# Patient Record
Sex: Female | Born: 1985 | Race: Black or African American | Hispanic: No | Marital: Single | Smoking: Former smoker
Health system: Southern US, Community
[De-identification: ages and names within clinical notes are randomized; demographics above are authoritative.]

## PROBLEM LIST (undated history)

## (undated) DIAGNOSIS — O139 Gestational [pregnancy-induced] hypertension without significant proteinuria, unspecified trimester: Secondary | ICD-10-CM

## (undated) DIAGNOSIS — F329 Major depressive disorder, single episode, unspecified: Secondary | ICD-10-CM

## (undated) DIAGNOSIS — IMO0002 Reserved for concepts with insufficient information to code with codable children: Secondary | ICD-10-CM

## (undated) DIAGNOSIS — R87619 Unspecified abnormal cytological findings in specimens from cervix uteri: Secondary | ICD-10-CM

## (undated) DIAGNOSIS — F32A Depression, unspecified: Secondary | ICD-10-CM

## (undated) DIAGNOSIS — R011 Cardiac murmur, unspecified: Secondary | ICD-10-CM

---
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
## 2005-07-09 ENCOUNTER — Inpatient Hospital Stay (HOSPITAL_COMMUNITY): Admission: RE | Admit: 2005-07-09 | Discharge: 2005-07-11 | Payer: Self-pay | Admitting: Psychiatry

## 2005-07-10 ENCOUNTER — Ambulatory Visit: Payer: Self-pay | Admitting: Psychiatry

## 2005-11-22 ENCOUNTER — Emergency Department (HOSPITAL_COMMUNITY): Admission: EM | Admit: 2005-11-22 | Discharge: 2005-11-22 | Payer: Self-pay | Admitting: Family Medicine

## 2006-01-16 ENCOUNTER — Emergency Department (HOSPITAL_COMMUNITY): Admission: EM | Admit: 2006-01-16 | Discharge: 2006-01-16 | Payer: Self-pay | Admitting: Emergency Medicine

## 2006-01-17 ENCOUNTER — Emergency Department (HOSPITAL_COMMUNITY): Admission: EM | Admit: 2006-01-17 | Discharge: 2006-01-17 | Payer: Self-pay | Admitting: Emergency Medicine

## 2006-02-26 ENCOUNTER — Emergency Department (HOSPITAL_COMMUNITY): Admission: EM | Admit: 2006-02-26 | Discharge: 2006-02-26 | Payer: Self-pay | Admitting: Family Medicine

## 2006-02-28 ENCOUNTER — Emergency Department (HOSPITAL_COMMUNITY): Admission: EM | Admit: 2006-02-28 | Discharge: 2006-02-28 | Payer: Self-pay | Admitting: Family Medicine

## 2006-11-18 ENCOUNTER — Emergency Department (HOSPITAL_COMMUNITY): Admission: EM | Admit: 2006-11-18 | Discharge: 2006-11-18 | Payer: Self-pay | Admitting: Emergency Medicine

## 2007-03-03 ENCOUNTER — Emergency Department (HOSPITAL_COMMUNITY): Admission: EM | Admit: 2007-03-03 | Discharge: 2007-03-03 | Payer: Self-pay | Admitting: Emergency Medicine

## 2007-07-26 ENCOUNTER — Emergency Department (HOSPITAL_COMMUNITY): Admission: EM | Admit: 2007-07-26 | Discharge: 2007-07-26 | Payer: Self-pay | Admitting: Family Medicine

## 2007-09-25 ENCOUNTER — Emergency Department (HOSPITAL_COMMUNITY): Admission: EM | Admit: 2007-09-25 | Discharge: 2007-09-25 | Payer: Self-pay | Admitting: Emergency Medicine

## 2007-11-26 ENCOUNTER — Emergency Department (HOSPITAL_COMMUNITY): Admission: EM | Admit: 2007-11-26 | Discharge: 2007-11-26 | Payer: Self-pay | Admitting: Emergency Medicine

## 2008-04-21 ENCOUNTER — Emergency Department (HOSPITAL_COMMUNITY): Admission: EM | Admit: 2008-04-21 | Discharge: 2008-04-21 | Payer: Self-pay | Admitting: Emergency Medicine

## 2009-02-17 ENCOUNTER — Emergency Department (HOSPITAL_COMMUNITY): Admission: EM | Admit: 2009-02-17 | Discharge: 2009-02-18 | Payer: Self-pay | Admitting: Emergency Medicine

## 2010-01-13 NOTE — L&D Delivery Note (Signed)
Delivery Note At 4:15 PM a viable female was delivered via Vaginal, Spontaneous Delivery (Presentation: ;  ).  APGAR: , ; weight .   Placenta status: Intact, Spontaneous Pathology.  Cord:  with the following complications: None.  Cord pH: not done  Anesthesia: Epidural  Episiotomy: None Lacerations: Sulcus Suture Repair: 2.0 Est. Blood Loss (mL): 250  Mom to postpartum.  Baby to nursery-stable.  MARSHALL,BERNARD A 11/22/2010, 4:24 PM

## 2010-02-12 ENCOUNTER — Emergency Department (HOSPITAL_COMMUNITY)
Admission: EM | Admit: 2010-02-12 | Discharge: 2010-02-12 | Payer: Self-pay | Source: Home / Self Care | Admitting: Family Medicine

## 2010-04-22 ENCOUNTER — Inpatient Hospital Stay (HOSPITAL_COMMUNITY): Payer: Medicaid Other

## 2010-04-22 ENCOUNTER — Encounter (HOSPITAL_COMMUNITY): Payer: Self-pay | Admitting: Radiology

## 2010-04-22 ENCOUNTER — Inpatient Hospital Stay (HOSPITAL_COMMUNITY)
Admission: AD | Admit: 2010-04-22 | Discharge: 2010-04-22 | Disposition: A | Discharge status: Home / Self Care | Payer: Medicaid Other | Source: Ambulatory Visit | Attending: Family Medicine | Admitting: Family Medicine

## 2010-04-22 DIAGNOSIS — N760 Acute vaginitis: Secondary | ICD-10-CM | POA: Insufficient documentation

## 2010-04-22 DIAGNOSIS — B9689 Other specified bacterial agents as the cause of diseases classified elsewhere: Secondary | ICD-10-CM

## 2010-04-22 DIAGNOSIS — O2390 Unspecified genitourinary tract infection in pregnancy, unspecified trimester: Secondary | ICD-10-CM

## 2010-04-22 DIAGNOSIS — R109 Unspecified abdominal pain: Secondary | ICD-10-CM | POA: Insufficient documentation

## 2010-04-22 DIAGNOSIS — A499 Bacterial infection, unspecified: Secondary | ICD-10-CM

## 2010-04-22 LAB — URINALYSIS, ROUTINE W REFLEX MICROSCOPIC
Bilirubin Urine: NEGATIVE
Glucose, UA: NEGATIVE mg/dL
Hgb urine dipstick: NEGATIVE
Ketones, ur: NEGATIVE mg/dL
Nitrite: NEGATIVE
Protein, ur: NEGATIVE mg/dL
Specific Gravity, Urine: >1.03 — ABNORMAL HIGH (ref 1.005–1.030)
Urobilinogen, UA: 0.2 mg/dL (ref 0.0–1.0)
pH: 6 (ref 5.0–8.0)

## 2010-04-22 LAB — HCG, QUANTITATIVE, PREGNANCY: hCG, Beta Chain, Quant, S: 5085 m[IU]/mL — ABNORMAL HIGH (ref <5)

## 2010-04-22 LAB — WET PREP, GENITAL

## 2010-04-22 LAB — POCT PREGNANCY, URINE: Preg Test, Ur: POSITIVE

## 2010-04-23 LAB — GC/CHLAMYDIA PROBE AMP, GENITAL: Chlamydia, DNA Probe: NEGATIVE

## 2010-04-24 LAB — POCT PREGNANCY, URINE: Preg Test, Ur: NEGATIVE

## 2010-04-24 LAB — WET PREP, GENITAL: WBC, Wet Prep HPF POC: NONE SEEN
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
## 2010-05-31 NOTE — Discharge Summary (Signed)
[facility]  Patient:    Sandra Lee, Sandra Lee                       MRN: [id number] Adm. Date:  [id number] Disc. Date: 02/11/00 Attending:  Veneta Penton                           Discharge Summary  REASON FOR ADMISSION:  This 25 year old black female was admitted involuntarily complaining of depression with suicidal ideation and plan to kill herself via drug overdose.  Her physical examination at the time of admission was significant only for allergic rhinitis and a heart murmur.  She also complained of occasional chest pain.  LABORATORY AND X-RAY DATA:  An EKG showed right atrial and ventricular enlargement.  An echocardiogram and pediatric cardiology consult have been scheduled for outpatient follow-up.  The patients other laboratory was significant for urine probe for gonorrhea and Chlamydia was negative.  A CBC showed an MCV of 73.4, MCHC of 35.2, and was otherwise unremarkable.  A routine chemistry panel was within normal limits.  Hepatic panel was within normal limits.  Thyroid function tests were within normal limits.  A urine pregnancy test was negative.  A urine drug screen was negative.  RPR was nonreactive.  A UA was unremarkable.  The patient received no x-rays, no special procedures, and no additional consultations.  She sustained no complications during the course of this hospitalization.  HOSPITAL COURSE:  The patient was initially begun on a trial of Remeron 30 mg p.o. q.h.s.  She reported that this helped her with sleep, but on the day of discharge has been complaining of increasing sedation from the Remeron as well as daily headaches from the medication.  We have discussed the risks, benefits, and side effects with the patient and her mother including alternatives to this as well as the alternative of using no medication, and we have agreed for a trial of Celexa 20 mg p.o. q.d.  The patient will start this on an outpatient basis.   She is presently attending all therapeutic treatment programs on the unit.  She has actively been engaged in psychotherapy, discussing her problems with her family, predominantly her mother and grandmother.  She at the time of discharge denies any homicidal or suicidal ideation.  Her affect and mood have improved as have her activities of daily living.  She is no longer displaying any psychomotor agitation, and consequently she is felt to have reached her maximum benefits of hospitalization as she no longer appears to be a danger to herself.  CONDITION ON DISCHARGE:  Improved.  CURRENT DIAGNOSES ACCORDING TO DSM4: Axis I:    1. Major depression, recurrent type, severe without psychosis.            2. Oppositional defiant disorder.            3. Rule out attention deficit hyperactivity disorder. Axis II:   Rule out learning disorder, not otherwise specified. Axis III:  Allergic rhinitis. Axis IV:   Current psychosocial stressors are severe. Axis V:    Code 20 on admission; code 30 on discharge.  FURTHER EVALUATION AND TREATMENT RECOMMENDATIONS:  DISCHARGE FOLLOWUP:  The patient is scheduled for follow-up with pediatric cardiology at [facility].  She is also scheduled for follow-up echocardiogram at [facility] on Friday, February 15.  She will follow up with [facility] and at [facility] for  all further aspects of her mental health care, and consequently, I will sign off on the case at this time.  DISCHARGE INSTRUCTIONS:  She is discharged on an unrestricted level of activity and a regular diet.  DISCHARGE MEDICATIONS:  She is discharged on Celexa 20 mg p.o. q.d. DD:  02/11/00 TD:  02/11/00 Job: [id number]

## 2010-07-16 LAB — RPR: RPR: NONREACTIVE

## 2010-07-16 LAB — HEPATITIS B SURFACE ANTIGEN: Hepatitis B Surface Ag: NEGATIVE

## 2010-09-27 ENCOUNTER — Inpatient Hospital Stay (HOSPITAL_COMMUNITY)
Admission: AD | Admit: 2010-09-27 | Discharge: 2010-09-27 | Disposition: A | Discharge status: Home / Self Care | Payer: Medicaid Other | Source: Ambulatory Visit | Attending: Obstetrics & Gynecology | Admitting: Obstetrics & Gynecology

## 2010-09-27 DIAGNOSIS — Z3480 Encounter for supervision of other normal pregnancy, unspecified trimester: Secondary | ICD-10-CM | POA: Insufficient documentation

## 2010-09-27 DIAGNOSIS — Z2989 Encounter for other specified prophylactic measures: Secondary | ICD-10-CM | POA: Insufficient documentation

## 2010-09-27 DIAGNOSIS — Z298 Encounter for other specified prophylactic measures: Secondary | ICD-10-CM | POA: Insufficient documentation

## 2010-09-27 MED ORDER — RHO D IMMUNE GLOBULIN 1500 UNIT/2ML IJ SOLN
300.0000 ug | Freq: Once | INTRAMUSCULAR | Status: AC
  Administered 2010-09-27: 300 ug via INTRAMUSCULAR
  Filled 2010-09-27: qty 2

## 2010-09-27 NOTE — ED Notes (Signed)
Patient given handout on Rhophylac explained wait time of 1 to 1/2 hours, pt verbalized an understanding.

## 2010-09-30 LAB — RH IG WORKUP (INCLUDES ABO/RH)
ABO/RH(D): O NEG
Gestational Age(Wks): 28
Unit division: 0

## 2010-10-04 LAB — POCT PREGNANCY, URINE: Operator id: 28833

## 2010-10-04 LAB — I-STAT 8, (EC8 V) (CONVERTED LAB)
Acid-base deficit: 3 — ABNORMAL HIGH
Bicarbonate: 21.6
Chloride: 109
HCT: 47 — ABNORMAL HIGH
Operator id: 288331
TCO2: 23
pCO2, Ven: 37 — ABNORMAL LOW
pH, Ven: 7.375 — ABNORMAL HIGH

## 2010-10-04 LAB — CBC
Hemoglobin: 13.9
MCHC: 33.7
RDW: 12.9

## 2010-10-04 LAB — DIFFERENTIAL
Basophils Absolute: 0
Basophils Relative: 0
Lymphocytes Relative: 14
Monocytes Absolute: 0.1
Neutro Abs: 9.8 — ABNORMAL HIGH
Neutrophils Relative %: 85 — ABNORMAL HIGH

## 2010-10-04 LAB — ETHANOL: Alcohol, Ethyl (B): 6

## 2010-10-04 LAB — POCT I-STAT CREATININE
Creatinine, Ser: 0.7
Operator id: 288331

## 2010-10-04 LAB — RAPID URINE DRUG SCREEN, HOSP PERFORMED
Amphetamines: NOT DETECTED
Barbiturates: NOT DETECTED

## 2010-10-04 LAB — PREGNANCY, URINE: Preg Test, Ur: NEGATIVE

## 2010-10-04 LAB — ACETAMINOPHEN LEVEL: Acetaminophen (Tylenol), Serum: <10 — ABNORMAL LOW

## 2010-10-04 LAB — SALICYLATE LEVEL: Salicylate Lvl: <4

## 2010-10-15 LAB — WET PREP, GENITAL
Trich, Wet Prep: NONE SEEN
Yeast Wet Prep HPF POC: NONE SEEN

## 2010-10-15 LAB — POCT URINALYSIS DIP (DEVICE)
Ketones, ur: NEGATIVE mg/dL
Protein, ur: >=300 mg/dL — AB
Specific Gravity, Urine: 1.02 (ref 1.005–1.030)
Urobilinogen, UA: 1 mg/dL (ref 0.0–1.0)
pH: 6.5 (ref 5.0–8.0)

## 2010-10-15 LAB — POCT PREGNANCY, URINE: Preg Test, Ur: NEGATIVE

## 2010-11-18 ENCOUNTER — Encounter (HOSPITAL_COMMUNITY): Payer: Self-pay | Admitting: *Deleted

## 2010-11-18 ENCOUNTER — Inpatient Hospital Stay (HOSPITAL_COMMUNITY)
Admission: AD | Admit: 2010-11-18 | Discharge: 2010-11-25 | DRG: 774 | Disposition: A | Discharge status: Home / Self Care | Payer: Medicaid Other | Source: Ambulatory Visit | Attending: Obstetrics & Gynecology | Admitting: Obstetrics & Gynecology

## 2010-11-18 DIAGNOSIS — IMO0002 Reserved for concepts with insufficient information to code with codable children: Principal | ICD-10-CM | POA: Diagnosis present

## 2010-11-18 DIAGNOSIS — O139 Gestational [pregnancy-induced] hypertension without significant proteinuria, unspecified trimester: Secondary | ICD-10-CM | POA: Diagnosis present

## 2010-11-18 HISTORY — DX: Depression, unspecified: F32.A

## 2010-11-18 HISTORY — DX: Unspecified abnormal cytological findings in specimens from cervix uteri: R87.619

## 2010-11-18 HISTORY — DX: Cardiac murmur, unspecified: R01.1

## 2010-11-18 HISTORY — DX: Major depressive disorder, single episode, unspecified: F32.9

## 2010-11-18 HISTORY — DX: Gestational (pregnancy-induced) hypertension without significant proteinuria, unspecified trimester: O13.9

## 2010-11-18 HISTORY — DX: Reserved for concepts with insufficient information to code with codable children: IMO0002

## 2010-11-18 MED ORDER — ZOLPIDEM TARTRATE 5 MG PO TABS: 5.0000 mg | ORAL_TABLET | Freq: Every evening | ORAL | Status: DC | PRN

## 2010-11-19 ENCOUNTER — Inpatient Hospital Stay (HOSPITAL_COMMUNITY): Payer: Medicaid Other

## 2010-11-19 ENCOUNTER — Encounter (HOSPITAL_COMMUNITY): Payer: Self-pay | Admitting: Obstetrics & Gynecology

## 2010-11-19 DIAGNOSIS — O139 Gestational [pregnancy-induced] hypertension without significant proteinuria, unspecified trimester: Secondary | ICD-10-CM | POA: Diagnosis present

## 2010-11-19 HISTORY — DX: Gestational (pregnancy-induced) hypertension without significant proteinuria, unspecified trimester: O13.9

## 2010-11-19 MED ORDER — PRENATAL PLUS 27-1 MG PO TABS
1.0000 | ORAL_TABLET | Freq: Every day | ORAL | Status: DC
  Administered 2010-11-19 – 2010-11-21 (×3): 1 via ORAL
  Filled 2010-11-19 (×3): qty 1

## 2010-11-19 MED ORDER — SODIUM CHLORIDE 0.9 % IJ SOLN
3.0000 mL | Freq: Two times a day (BID) | INTRAMUSCULAR | Status: DC
  Administered 2010-11-19 – 2010-11-21 (×5): 3 mL via INTRAVENOUS

## 2010-11-19 MED ORDER — FERROUS SULFATE 325 (65 FE) MG PO TABS: 325.0000 mg | ORAL_TABLET | Freq: Two times a day (BID) | ORAL | Status: DC

## 2010-11-19 NOTE — Consult Note (Signed)
MATERNAL FETAL MEDICINE CONSULT  Patient Name: Sandra Lee Medical Record Number:  [id number] Date of Birth: [birth year] Requesting Physician Name:  Roseanna Rainbow, MD Date of Service: 11/19/2010  Chief Complaint  Third trimester  pregnancy Hypertension  History of Present Illness [name] was seen today for prenatal diagnosis secondary to development of increased blood pressure in the office at the request of Dr. Tamela Oddi.  The patient is a 25 y.o. G1P0000, with an EDD of 12/19/2010, by Last Menstrual Period dating method.  She has no apparent pre-existing history of hypertension, autoimmune, or renal disease.  She does have a family history of hypertension, stroke and diabetes.  Lab values were obtained in office either yesterday or today, but are not available on the record. She denies headache, visual signs or abdominal pain.  The fetus is active. Since hospitalization, Brennyn has continued to have all but one blood pressure readings 144-157 SBP/91-96 DBP  Review of Systems Pertinent items are noted in HPI.  Patient History OB History    Grav Para Term Preterm Abortions TAB SAB Ect Mult Living   1 0 0 0 0 0 0 0 0 0      # Outc Date GA Lbr Len/2nd Wgt Sex Del Anes PTL Lv   1 CUR               Past Medical History  Diagnosis Date  . Heart murmur 25 yrs old    not sure if it is still present.  . Abnormal Pap smear   . Depression     hx of depression - not taking medication  . PIH (pregnancy induced hypertension), antepartum 11/19/2010    History reviewed. No pertinent past surgical history.  History   Social History  . Marital Status: Single    Spouse Name: N/A    Number of Children: N/A  . Years of Education: N/A   Social History Main Topics  . Smoking status: Former Smoker -- 0.5 packs/day    Types: Cigarettes    Quit date: 09/18/2010  . Smokeless tobacco: None  . Alcohol Use: No  . Drug Use: No  . Sexually Active: Yes    Birth Control/  Protection: Injection   Other Topics Concern  . None   Social History Narrative  . None    Family History  Problem Relation Age of Onset  . Depression Mother   . Early death Father   . Arthritis Sister   . Diabetes Maternal Aunt   . Early death Maternal Aunt   . Alcohol abuse Maternal Uncle   . Diabetes Maternal Uncle   . Diabetes Paternal Aunt   . Diabetes Paternal Uncle   . Arthritis Maternal Grandmother   . Diabetes Maternal Grandmother   . Heart disease Maternal Grandmother   . Stroke Maternal Grandmother   . Diabetes Maternal Grandfather    In addition, the patient has no family history of mental retardation, birth defects, or genetic diseases.  Physical Examination Filed Vitals:   11/19/10 1614  BP: 157/94  Pulse: 81  Temp: 98.3 F (36.8 C)  Resp: 20   General exam: Normal HEENT No neck masses.  Normal pulmonary exam. Normal cardiac exam.  Slight RUQ tenderness to deep palpation. No fundal tenderness.  Reflexes 2/4. 2+ edema.   Ultrasound of fetus; Normal amniotic fluid, cephalic presentation.  Estimated fetal weight is 2.3 kg, with abdominal circumference <3%le.   Assessment and Recommendations: Findings consistent with either gestational hypertension or preeclampsia.  With fetal size suggesting SGA/IUGR, this may be representative of severe preeclampsia.  Awaiting initial lab assessments.  1. Continue hospital monitoring of maternal and fetal condition; 2. Repeat renal, hepatic and platelet functions 1-2 times/week; deliver if evidence of HELLP 3. Daily NST's.  4. Weekly U/S reassessment of AFI. Obtain fetal umbilical artery Doppler studies tomorrow.  6. Deliver if evidence of worsening headaches, BP's persistently >160 mmHg systolic, >100 mmHg diastolic; persistent headaches or visual signs;  Evidence of HELLP; bleeding; abnormal fetal testing, or lack of response of BP to rest with underlying SGA/IUGR fetus.  7. Deliver at 37 weeks if stable before  then.   I spent 20 minutes in face to face consultation with the patient. Thank you for the opportunity to work with [facility]

## 2010-11-19 NOTE — H&P (Signed)
Sandra Lee is Lee 25 y.o. female presenting for further evaluation of elevated blood pressures. Maternal Medical History:  Reason for admission: Elevated BPs in the office.  No neurological symptoms.  Fetal activity: Perceived fetal activity is normal.    Prenatal complications: no prenatal complications   OB History    Grav Para Term Preterm Abortions TAB SAB Ect Mult Living   1 0 0 0 0 0 0 0 0 0      Past Medical History  Diagnosis Date  . Heart murmur 25 yrs old    not sure if it is still present.  . Abnormal Pap smear   . Depression     hx of depression - not taking medication  . PIH (pregnancy induced hypertension), antepartum 11/19/2010   History reviewed. No pertinent past surgical history. Family History: family history includes Alcohol abuse in her maternal uncle; Arthritis in her maternal grandmother and sister; Depression in her mother; Diabetes in her maternal aunt, maternal grandfather, maternal grandmother, maternal uncle, paternal aunt, and paternal uncle; Early death in her father and maternal aunt; Heart disease in her maternal grandmother; and Stroke in her maternal grandmother. Social History:  reports that she quit smoking about 2 months ago. Her smoking use included Cigarettes. She smoked .5 packs per day. She does not have any smokeless tobacco history on file. She reports that she does not drink alcohol or use illicit drugs.  Review of Systems  Constitutional: Negative for fever.  Eyes: Negative for blurred vision.  Respiratory: Negative for shortness of breath.   Gastrointestinal: Negative for vomiting.  Skin: Negative for rash.  Neurological: Negative.  Negative for headaches.    Dilation: 1 Effacement (%): Thick Exam by:: Sandra Lee Blood pressure 156/96, pulse 78, temperature 97.7 F (36.5 C), temperature source Oral, resp. rate 20, height 5\' 5"  (1.651 m), weight 73.936 kg (163 lb), last menstrual period 03/14/2010, SpO2 100.00%. Maternal  Exam:  Abdomen: Patient reports no abdominal tenderness. Introitus: not evaluated.     Fetal Exam Fetal Monitor Review: Variability: moderate (6-25 bpm).   Pattern: accelerations present and no decelerations.    Fetal State Assessment: Category I - tracings are normal.     Physical Exam  Constitutional: She appears well-developed.  HENT:  Head: Normocephalic.  Neck: Neck supple. No thyromegaly present.  Cardiovascular: Normal rate and regular rhythm.   Respiratory: Breath sounds normal.  GI: Soft. Bowel sounds are normal.  Musculoskeletal: She exhibits no edema.  Skin: No rash noted.    Prenatal labs: ABO, Rh: --/--/O NEG (09/14 1335) Antibody: NEG (09/14 1335) Rubella:   RPR:    HBsAg:    HIV:    GBS:     Assessment/Plan: 25 y.o. with an IUP at [redacted]w[redacted]d.  PIH.  Labile BPs.  No neurological symptoms. FHT reassuring  MFM consult with U/S for growth Check PIH labs, 24 hr urine Bed rest   Sandra Lee 11/19/2010, 10:04 AM

## 2010-11-20 ENCOUNTER — Inpatient Hospital Stay (HOSPITAL_COMMUNITY): Payer: Medicaid Other

## 2010-11-20 LAB — COMPREHENSIVE METABOLIC PANEL
BUN: 5 mg/dL — ABNORMAL LOW (ref 6–23)
Calcium: 9.7 mg/dL (ref 8.4–10.5)
Creatinine, Ser: 0.52 mg/dL (ref 0.50–1.10)
GFR calc Af Amer: >90 mL/min (ref >90)
Glucose, Bld: 130 mg/dL — ABNORMAL HIGH (ref 70–99)
Total Protein: 6.3 g/dL (ref 6.0–8.3)

## 2010-11-20 LAB — PROTEIN, URINE, 24 HOUR: Urine Total Volume-UPROT: 1100 mL

## 2010-11-20 LAB — CBC
HCT: 33.5 % — ABNORMAL LOW (ref 36.0–46.0)
Hemoglobin: 11.6 g/dL — ABNORMAL LOW (ref 12.0–15.0)
MCH: 27.7 pg (ref 26.0–34.0)
MCHC: 34.6 g/dL (ref 30.0–36.0)
RDW: 14 % (ref 11.5–15.5)

## 2010-11-20 LAB — CREATININE, URINE, 24 HOUR
Collection Interval-UCRE24: 24 hours
Urine Total Volume-UCRE24: 1100 mL

## 2010-11-20 MED ORDER — INFLUENZA VIRUS VACC SPLIT PF IM SUSP
0.5000 mL | Freq: Once | INTRAMUSCULAR | Status: AC
  Administered 2010-11-20: 0.5 mL via INTRAMUSCULAR
  Filled 2010-11-20: qty 0.5

## 2010-11-20 MED ORDER — TETANUS-DIPHTH-ACELL PERTUSSIS 5-2.5-18.5 LF-MCG/0.5 IM SUSP
0.5000 mL | Freq: Once | INTRAMUSCULAR | Status: AC
  Administered 2010-11-20: 0.5 mL via INTRAMUSCULAR
  Filled 2010-11-20: qty 0.5

## 2010-11-20 NOTE — Progress Notes (Signed)
UR chart review completed.  

## 2010-11-20 NOTE — Progress Notes (Signed)
Admission Health Hx assessment trigger for unintentional weight loss of > 10 lbs. Pt had a 12 Lbs weight gain in the 3 weeks PTA. Pt not at nutritional risk.

## 2010-11-20 NOTE — Progress Notes (Signed)
S: No complaints  O: Blood pressure 166/90, pulse 80, temperature 97.6 F (36.4 C), temperature source Oral, resp. rate 20, height 5\' 5"  (1.651 m), weight 72.576 kg (160 lb), last menstrual period 03/14/2010, SpO2 100.00%.   ZOX:WRUEAVWU: 140 bpm, Variability: Good {> 6 bpm), Accelerations: Reactive and Decelerations: Absent Toco: None [id number]:XBJYNWGN: 1 Effacement (%): Thick Exam by:: Dr. Tamela Oddi  A/P- 25 y.o. admitted with pre-eclampsia  Dating:  [redacted]w[redacted]d Continue close maternal/fetal surveillance Serial labs U/A doppler today per MFM TDAP/flu vaccine

## 2010-11-21 LAB — CBC
HCT: 33 % — ABNORMAL LOW (ref 36.0–46.0)
Hemoglobin: 11.4 g/dL — ABNORMAL LOW (ref 12.0–15.0)
MCHC: 34.5 g/dL (ref 30.0–36.0)
MCV: 79.9 fL (ref 78.0–100.0)
RDW: 14 % (ref 11.5–15.5)

## 2010-11-21 MED ORDER — LACTATED RINGERS IV SOLN
INTRAVENOUS | Status: DC
  Administered 2010-11-22: 20:00:00 via INTRAVENOUS

## 2010-11-21 MED ORDER — ONDANSETRON HCL 4 MG/2ML IJ SOLN
4.0000 mg | Freq: Four times a day (QID) | INTRAMUSCULAR | Status: DC | PRN
  Administered 2010-11-22: 4 mg via INTRAVENOUS
  Filled 2010-11-21: qty 2

## 2010-11-21 MED ORDER — HYDROXYZINE HCL 50 MG PO TABS
50.0000 mg | ORAL_TABLET | Freq: Four times a day (QID) | ORAL | Status: DC | PRN
  Filled 2010-11-21: qty 1

## 2010-11-21 MED ORDER — HYDROXYZINE HCL 50 MG/ML IM SOLN
50.0000 mg | Freq: Four times a day (QID) | INTRAMUSCULAR | Status: DC | PRN
  Filled 2010-11-21: qty 1

## 2010-11-21 MED ORDER — TERBUTALINE SULFATE 1 MG/ML IJ SOLN: 0.2500 mg | Freq: Once | INTRAMUSCULAR | Status: AC | PRN

## 2010-11-21 MED ORDER — OXYCODONE-ACETAMINOPHEN 5-325 MG PO TABS
2.0000 | ORAL_TABLET | ORAL | Status: DC | PRN
  Filled 2010-11-21 (×2): qty 1
  Filled 2010-11-21 (×2): qty 2

## 2010-11-21 MED ORDER — OXYTOCIN 20 UNITS IN LACTATED RINGERS INFUSION - SIMPLE
125.0000 mL/h | Freq: Once | INTRAVENOUS | Status: AC
  Administered 2010-11-22: 500 mL/h via INTRAVENOUS
  Filled 2010-11-21: qty 1000

## 2010-11-21 MED ORDER — LACTATED RINGERS IV SOLN
INTRAVENOUS | Status: DC
  Administered 2010-11-21 – 2010-11-22 (×3): via INTRAVENOUS

## 2010-11-21 MED ORDER — OXYTOCIN BOLUS FROM INFUSION
500.0000 mL | Freq: Once | INTRAVENOUS | Status: DC
  Filled 2010-11-21: qty 500

## 2010-11-21 MED ORDER — CEFAZOLIN SODIUM 1-5 GM-% IV SOLN
1.0000 g | Freq: Three times a day (TID) | INTRAVENOUS | Status: DC
  Administered 2010-11-22 (×2): 1 g via INTRAVENOUS
  Filled 2010-11-21 (×4): qty 50

## 2010-11-21 MED ORDER — MAGNESIUM SULFATE BOLUS VIA INFUSION
4.0000 g | Freq: Once | INTRAVENOUS | Status: AC
  Administered 2010-11-21: 4 g via INTRAVENOUS
  Filled 2010-11-21: qty 500

## 2010-11-21 MED ORDER — ZOLPIDEM TARTRATE 10 MG PO TABS
10.0000 mg | ORAL_TABLET | Freq: Every evening | ORAL | Status: DC | PRN
  Administered 2010-11-21: 10 mg via ORAL
  Filled 2010-11-21: qty 1

## 2010-11-21 MED ORDER — MAGNESIUM SULFATE 40 G IN LACTATED RINGERS - SIMPLE
2.0000 g/h | INTRAVENOUS | Status: AC
  Administered 2010-11-22: 2 g/h via INTRAVENOUS
  Filled 2010-11-21: qty 500

## 2010-11-21 MED ORDER — MAGNESIUM SULFATE 40 G IN LACTATED RINGERS - SIMPLE: 2.0000 g/h | INTRAVENOUS | Status: DC

## 2010-11-21 MED ORDER — MAGNESIUM SULFATE BOLUS VIA INFUSION: 4.0000 g | Freq: Once | INTRAVENOUS | Status: DC

## 2010-11-21 MED ORDER — ACETAMINOPHEN 325 MG PO TABS: 650.0000 mg | ORAL_TABLET | ORAL | Status: DC | PRN

## 2010-11-21 MED ORDER — LIDOCAINE HCL (PF) 1 % IJ SOLN
30.0000 mL | INTRAMUSCULAR | Status: DC | PRN
  Filled 2010-11-21: qty 30

## 2010-11-21 MED ORDER — IBUPROFEN 600 MG PO TABS
600.0000 mg | ORAL_TABLET | Freq: Four times a day (QID) | ORAL | Status: DC | PRN
  Administered 2010-11-23 – 2010-11-24 (×3): 600 mg via ORAL
  Filled 2010-11-21 (×5): qty 1

## 2010-11-21 MED ORDER — CITRIC ACID-SODIUM CITRATE 334-500 MG/5ML PO SOLN: 30.0000 mL | ORAL | Status: DC | PRN

## 2010-11-21 MED ORDER — MISOPROSTOL 25 MCG QUARTER TABLET
25.0000 ug | ORAL_TABLET | ORAL | Status: DC | PRN
  Administered 2010-11-21 – 2010-11-22 (×3): 25 ug via VAGINAL
  Filled 2010-11-21: qty 0.25
  Filled 2010-11-21: qty 1
  Filled 2010-11-21 (×2): qty 0.25

## 2010-11-21 MED ORDER — LACTATED RINGERS IV SOLN: 500.0000 mL | INTRAVENOUS | Status: DC | PRN

## 2010-11-21 MED ORDER — LABETALOL HCL 5 MG/ML IV SOLN
40.0000 mg | INTRAVENOUS | Status: AC | PRN
  Administered 2010-11-22 (×2): 40 mg via INTRAVENOUS
  Filled 2010-11-21 (×2): qty 8

## 2010-11-21 MED ORDER — CEFAZOLIN SODIUM-DEXTROSE 2-3 GM-% IV SOLR
2.0000 g | Freq: Once | INTRAVENOUS | Status: AC
  Administered 2010-11-21: 2 g via INTRAVENOUS
  Filled 2010-11-21: qty 50

## 2010-11-21 MED ORDER — BUTORPHANOL TARTRATE 2 MG/ML IJ SOLN
1.0000 mg | INTRAMUSCULAR | Status: DC | PRN
  Administered 2010-11-22 (×2): 1 mg via INTRAVENOUS
  Filled 2010-11-21 (×2): qty 1

## 2010-11-21 NOTE — Progress Notes (Signed)
S: Resting comfortably.  O: Blood pressure 137/99, pulse 93, temperature 97.6 F (36.4 C), temperature source Oral, resp. rate 18, height 5\' 5"  (1.651 m), weight 72.576 kg (160 lb), last menstrual period 03/14/2010, SpO2 100.00%.   EAV:WUJWJXBJ: 140 bpm, Variability: Good {> 6 bpm), Accelerations: Reactive and Decelerations: Absent Toco: None YNW:GNFAOZHY: 1 Effacement (%): Thick Exam by:: Dr. Tamela Oddi  A/P- 25 y.o. admitted with pre-eclampsia.  Elevated BPs.  Dating:  [redacted]w[redacted]d Transfer-->L&D for IOL  ROD: spontaneous vaginal

## 2010-11-22 ENCOUNTER — Other Ambulatory Visit: Payer: Self-pay | Admitting: Obstetrics

## 2010-11-22 ENCOUNTER — Inpatient Hospital Stay (HOSPITAL_COMMUNITY): Payer: Medicaid Other | Admitting: Anesthesiology

## 2010-11-22 ENCOUNTER — Encounter (HOSPITAL_COMMUNITY): Payer: Self-pay | Admitting: *Deleted

## 2010-11-22 ENCOUNTER — Encounter (HOSPITAL_COMMUNITY): Payer: Self-pay | Admitting: Anesthesiology

## 2010-11-22 LAB — CBC
Hemoglobin: 11.5 g/dL — ABNORMAL LOW (ref 12.0–15.0)
Hemoglobin: 12.4 g/dL (ref 12.0–15.0)
MCHC: 35.3 g/dL (ref 30.0–36.0)
MCHC: 34.4 g/dL (ref 30.0–36.0)
Platelets: 122 10*3/uL — ABNORMAL LOW (ref 150–400)
RBC: 4.41 MIL/uL (ref 3.87–5.11)
RDW: 13.9 % (ref 11.5–15.5)
RDW: 14.1 % (ref 11.5–15.5)
WBC: 9.2 10*3/uL (ref 4.0–10.5)
WBC: 11.7 10*3/uL — ABNORMAL HIGH (ref 4.0–10.5)
WBC: 11.4 10*3/uL — ABNORMAL HIGH (ref 4.0–10.5)

## 2010-11-22 LAB — RPR: RPR Ser Ql: NONREACTIVE

## 2010-11-22 MED ORDER — ZOLPIDEM TARTRATE 5 MG PO TABS: 5.0000 mg | ORAL_TABLET | Freq: Every evening | ORAL | Status: DC | PRN

## 2010-11-22 MED ORDER — DIPHENHYDRAMINE HCL 25 MG PO CAPS: 25.0000 mg | ORAL_CAPSULE | Freq: Four times a day (QID) | ORAL | Status: DC | PRN

## 2010-11-22 MED ORDER — LACTATED RINGERS IV SOLN
500.0000 mL | Freq: Once | INTRAVENOUS | Status: AC
  Administered 2010-11-22: 500 mL via INTRAVENOUS

## 2010-11-22 MED ORDER — BUPIVACAINE HCL (PF) 0.25 % IJ SOLN
INTRAMUSCULAR | Status: DC | PRN
  Administered 2010-11-22 (×2): 5 mL via EPIDURAL

## 2010-11-22 MED ORDER — PHENYLEPHRINE 40 MCG/ML (10ML) SYRINGE FOR IV PUSH (FOR BLOOD PRESSURE SUPPORT)
80.0000 ug | PREFILLED_SYRINGE | INTRAVENOUS | Status: DC | PRN
  Filled 2010-11-22: qty 5

## 2010-11-22 MED ORDER — LIDOCAINE HCL 1.5 % IJ SOLN
INTRAMUSCULAR | Status: DC | PRN
  Administered 2010-11-22 (×2): 5 mL via INTRADERMAL
  Administered 2010-11-22: 2 mL via INTRADERMAL

## 2010-11-22 MED ORDER — OXYTOCIN 10 UNIT/ML IJ SOLN
INTRAMUSCULAR | Status: AC
  Filled 2010-11-22: qty 2

## 2010-11-22 MED ORDER — FENTANYL 2.5 MCG/ML BUPIVACAINE 1/10 % EPIDURAL INFUSION (WH - ANES)
14.0000 mL/h | INTRAMUSCULAR | Status: DC
  Administered 2010-11-22 (×2): 14 mL/h via EPIDURAL
  Filled 2010-11-22 (×2): qty 60

## 2010-11-22 MED ORDER — WITCH HAZEL-GLYCERIN EX PADS: 1.0000 "application " | MEDICATED_PAD | CUTANEOUS | Status: DC | PRN

## 2010-11-22 MED ORDER — FENTANYL CITRATE 0.05 MG/ML IJ SOLN: 100.0000 ug | Freq: Once | INTRAMUSCULAR | Status: DC

## 2010-11-22 MED ORDER — FENTANYL CITRATE 0.05 MG/ML IJ SOLN
INTRAMUSCULAR | Status: DC | PRN
  Administered 2010-11-22: 100 ug via EPIDURAL

## 2010-11-22 MED ORDER — OXYTOCIN 20 UNITS IN LACTATED RINGERS INFUSION - SIMPLE
1.0000 m[IU]/min | INTRAVENOUS | Status: DC
  Administered 2010-11-22: 2 m[IU]/min via INTRAVENOUS
  Filled 2010-11-22: qty 1000

## 2010-11-22 MED ORDER — PHENYLEPHRINE 40 MCG/ML (10ML) SYRINGE FOR IV PUSH (FOR BLOOD PRESSURE SUPPORT): 80.0000 ug | PREFILLED_SYRINGE | INTRAVENOUS | Status: DC | PRN

## 2010-11-22 MED ORDER — IBUPROFEN 600 MG PO TABS
600.0000 mg | ORAL_TABLET | Freq: Four times a day (QID) | ORAL | Status: DC
  Administered 2010-11-22 – 2010-11-25 (×7): 600 mg via ORAL
  Filled 2010-11-22 (×5): qty 1

## 2010-11-22 MED ORDER — BENZOCAINE-MENTHOL 20-0.5 % EX AERO: 1.0000 "application " | INHALATION_SPRAY | CUTANEOUS | Status: DC | PRN

## 2010-11-22 MED ORDER — ONDANSETRON HCL 4 MG PO TABS: 4.0000 mg | ORAL_TABLET | ORAL | Status: DC | PRN

## 2010-11-22 MED ORDER — TETANUS-DIPHTH-ACELL PERTUSSIS 5-2.5-18.5 LF-MCG/0.5 IM SUSP
0.5000 mL | Freq: Once | INTRAMUSCULAR | Status: DC
  Filled 2010-11-22: qty 0.5

## 2010-11-22 MED ORDER — PRENATAL PLUS 27-1 MG PO TABS
1.0000 | ORAL_TABLET | Freq: Every day | ORAL | Status: DC
  Administered 2010-11-23 – 2010-11-25 (×3): 1 via ORAL
  Filled 2010-11-22 (×3): qty 1

## 2010-11-22 MED ORDER — DIPHENHYDRAMINE HCL 50 MG/ML IJ SOLN: 12.5000 mg | INTRAMUSCULAR | Status: DC | PRN

## 2010-11-22 MED ORDER — LANOLIN HYDROUS EX OINT: TOPICAL_OINTMENT | CUTANEOUS | Status: DC | PRN

## 2010-11-22 MED ORDER — FERROUS SULFATE 325 (65 FE) MG PO TABS
325.0000 mg | ORAL_TABLET | Freq: Two times a day (BID) | ORAL | Status: DC
  Administered 2010-11-23 – 2010-11-25 (×5): 325 mg via ORAL
  Filled 2010-11-22 (×5): qty 1

## 2010-11-22 MED ORDER — EPHEDRINE 5 MG/ML INJ
10.0000 mg | INTRAVENOUS | Status: DC | PRN
  Filled 2010-11-22: qty 4

## 2010-11-22 MED ORDER — EPHEDRINE 5 MG/ML INJ: 10.0000 mg | INTRAVENOUS | Status: DC | PRN

## 2010-11-22 MED ORDER — OXYCODONE-ACETAMINOPHEN 5-325 MG PO TABS
1.0000 | ORAL_TABLET | ORAL | Status: DC | PRN
  Administered 2010-11-22 – 2010-11-23 (×4): 1 via ORAL
  Administered 2010-11-23: 2 via ORAL
  Administered 2010-11-24 – 2010-11-25 (×8): 1 via ORAL
  Filled 2010-11-22 (×10): qty 1

## 2010-11-22 MED ORDER — ONDANSETRON HCL 4 MG/2ML IJ SOLN: 4.0000 mg | INTRAMUSCULAR | Status: DC | PRN

## 2010-11-22 MED ORDER — SENNOSIDES-DOCUSATE SODIUM 8.6-50 MG PO TABS
2.0000 | ORAL_TABLET | Freq: Every day | ORAL | Status: DC
  Administered 2010-11-23 – 2010-11-24 (×3): 2 via ORAL

## 2010-11-22 MED ORDER — LACTATED RINGERS IV SOLN: INTRAVENOUS | Status: DC

## 2010-11-22 MED ORDER — SIMETHICONE 80 MG PO CHEW: 80.0000 mg | CHEWABLE_TABLET | ORAL | Status: DC | PRN

## 2010-11-22 MED ORDER — DIBUCAINE 1 % RE OINT: 1.0000 "application " | TOPICAL_OINTMENT | RECTAL | Status: DC | PRN

## 2010-11-22 MED ORDER — FENTANYL CITRATE 0.05 MG/ML IJ SOLN
INTRAMUSCULAR | Status: AC
  Filled 2010-11-22: qty 2

## 2010-11-22 NOTE — Plan of Care (Signed)
Problem: Consults Goal: Birthing Suites Patient Information Press F2 to bring up selections list   Pt < [redacted] weeks EGA     

## 2010-11-22 NOTE — Anesthesia Preprocedure Evaluation (Signed)
Anesthesia Evaluation  Patient identified by MRN, date of birth, ID band Patient awake    Reviewed: Allergy & Precautions, H&P , NPO status , Patient's Chart, lab work & pertinent test results, reviewed documented beta blocker date and time   History of Anesthesia Complications Negative for: history of anesthetic complications  Airway Mallampati: III TM Distance: >3 FB Neck ROM: full    Dental  (+) Teeth Intact   Pulmonary former smoker (quit less than 1 month ago) clear to auscultation        Cardiovascular hypertension (preeclampsia, on magnesium), regular Normal    Neuro/Psych PSYCHIATRIC DISORDERS (depression) Negative Neurological ROS     GI/Hepatic negative GI ROS, Neg liver ROS,   Endo/Other  Negative Endocrine ROS  Renal/GU negative Renal ROS  Genitourinary negative   Musculoskeletal   Abdominal   Peds  Hematology negative hematology ROS (+)   Anesthesia Other Findings   Reproductive/Obstetrics (+) Pregnancy                           Anesthesia Physical Anesthesia Plan  ASA: II  Anesthesia Plan: Epidural   Post-op Pain Management:    Induction:   Airway Management Planned:   Additional Equipment:   Intra-op Plan:   Post-operative Plan:   Informed Consent: I have reviewed the patients History and Physical, chart, labs and discussed the procedure including the risks, benefits and alternatives for the proposed anesthesia with the patient or authorized representative who has indicated his/her understanding and acceptance.     Plan Discussed with:   Anesthesia Plan Comments:         Anesthesia Quick Evaluation

## 2010-11-22 NOTE — Anesthesia Procedure Notes (Signed)
Epidural Patient location during procedure: OB Start time: 11/22/2010 12:31 PM Reason for block: procedure for pain  Staffing Performed by: anesthesiologist   Preanesthetic Checklist Completed: patient identified, site marked, surgical consent, pre-op evaluation, timeout performed, IV checked, risks and benefits discussed and monitors and equipment checked  Epidural Patient position: sitting Prep: site prepped and draped and DuraPrep Patient monitoring: continuous pulse ox and blood pressure Approach: midline Injection technique: LOR air  Needle:  Needle type: Tuohy  Needle gauge: 17 G Needle length: 9 cm Needle insertion depth: 5 cm cm Catheter type: closed end flexible Catheter size: 19 Gauge Catheter at skin depth: 10 cm Test dose: negative  Assessment Events: blood not aspirated, injection not painful, no injection resistance, negative IV test and no paresthesia  Additional Notes Discussed risk of headache, infection, bleeding, nerve injury and failed or incomplete block.  Patient voices understanding and wishes to proceed.

## 2010-11-22 NOTE — Consult Note (Signed)
Called to attend C/S at 36 weeks with pre-eclampsia. At birth infant in vertex, birth spontaneous vaginal with spontaneous cries and active tone. Placed under radiant warmer and given tactile stim with vigorous drying and bulb suction to clear naso/oropharyna. No dysmorphic features.   RN requested to maintain close observation that infant is not exposed unduly to room temp. Transitional Nursery notified of infant's progress and plan to admit in near future.   Dagoberto Ligas MD [facility]

## 2010-11-22 NOTE — Progress Notes (Signed)
  Membranes ruptured spontaneously at 9:45 AM she is now 2 cm 80% and vertex at a -2 station contracting every 2 minutes an IUPC was inserted and

## 2010-11-22 NOTE — Plan of Care (Signed)
Problem: Consults Goal: Birthing Suites Patient Information Press F2 to bring up selections list  Outcome: Completed/Met Date Met:  11/22/10  Pt < [redacted] weeks EGA

## 2010-11-23 LAB — CBC
Platelets: 123 10*3/uL — ABNORMAL LOW (ref 150–400)
RBC: 3.81 MIL/uL — ABNORMAL LOW (ref 3.87–5.11)
WBC: 12 10*3/uL — ABNORMAL HIGH (ref 4.0–10.5)

## 2010-11-23 LAB — MRSA PCR SCREENING: MRSA by PCR: NEGATIVE

## 2010-11-23 MED ORDER — LABETALOL HCL 5 MG/ML IV SOLN
10.0000 mg | Freq: Once | INTRAVENOUS | Status: AC | PRN
  Administered 2010-11-23: 10 mg via INTRAVENOUS
  Filled 2010-11-23: qty 4

## 2010-11-23 MED ORDER — RHO D IMMUNE GLOBULIN 1500 UNIT/2ML IJ SOLN
300.0000 ug | Freq: Once | INTRAMUSCULAR | Status: AC
  Administered 2010-11-23: 300 ug via INTRAMUSCULAR
  Filled 2010-11-23: qty 2

## 2010-11-23 MED ORDER — NIFEDIPINE ER 30 MG PO TB24
30.0000 mg | ORAL_TABLET | Freq: Once | ORAL | Status: AC
  Administered 2010-11-23: 30 mg via ORAL
  Filled 2010-11-23: qty 1

## 2010-11-23 MED ORDER — BENZOCAINE-MENTHOL 20-0.5 % EX AERO
INHALATION_SPRAY | CUTANEOUS | Status: AC
  Administered 2010-11-23: 08:00:00
  Filled 2010-11-23: qty 56

## 2010-11-23 NOTE — Progress Notes (Signed)
  Postpartum day one Vital signs normal blood pressure is okay patient's magnesium sulfate was discontinued today Fundus firm Legs negative No complaints

## 2010-11-23 NOTE — Progress Notes (Signed)
Verbal SBar report to [place], RN on [place] for pt transfer. Pt transferred ambulatory to rm #[id number]

## 2010-11-24 LAB — RH IG WORKUP (INCLUDES ABO/RH)
ABO/RH(D): O NEG
Fetal Screen: NEGATIVE
Gestational Age(Wks): 36.1

## 2010-11-24 MED ORDER — LABETALOL HCL 200 MG PO TABS
200.0000 mg | ORAL_TABLET | Freq: Three times a day (TID) | ORAL | Status: DC
  Administered 2010-11-24 – 2010-11-25 (×4): 200 mg via ORAL
  Filled 2010-11-24 (×4): qty 1

## 2010-11-24 NOTE — Progress Notes (Signed)
  Postpartum day 2 Her blood pressures is still elevated Fundus firm Lochia moderate Legs negative Discharge will be postponed until tomorrow and she was started on labetalol 200 mg 3 times a day as of today

## 2010-11-24 NOTE — Anesthesia Postprocedure Evaluation (Signed)
  Anesthesia Post-op Note  Patient: Sandra Lee  Procedure(s) Performed: * No procedures listed *  Patient Location: PACU and Mother/Baby  Anesthesia Type: Epidural  Level of Consciousness: awake, alert  and oriented  Airway and Oxygen Therapy: Patient Spontanous Breathing   Post-op Assessment: Patient's Cardiovascular Status Stable and Respiratory Function Stable  Post-op Vital Signs: stable  Complications: No apparent anesthesia complications

## 2010-11-25 MED ORDER — LABETALOL HCL 200 MG PO TABS: 200.0000 mg | ORAL_TABLET | Freq: Three times a day (TID) | ORAL | Status: DC

## 2010-11-25 MED ORDER — MEDROXYPROGESTERONE ACETATE 150 MG/ML IM SUSP
150.0000 mg | Freq: Once | INTRAMUSCULAR | Status: AC
  Administered 2010-11-25: 150 mg via INTRAMUSCULAR
  Filled 2010-11-25: qty 1

## 2010-11-25 MED ORDER — IBUPROFEN 600 MG PO TABS: 600.0000 mg | ORAL_TABLET | Freq: Four times a day (QID) | ORAL | Status: AC | PRN

## 2010-11-25 MED ORDER — OXYCODONE-ACETAMINOPHEN 5-325 MG PO TABS: 1.0000 | ORAL_TABLET | ORAL | Status: AC | PRN

## 2010-11-25 NOTE — Discharge Summary (Signed)
  Obstetric Discharge Summary Reason for Admission: PIH Prenatal Procedures: none Intrapartum Procedures: spontaneous vaginal delivery Postpartum Procedures: none Complications-Operative and Postpartum: none  Hemoglobin  Date Value Range Status  11/23/2010 10.4* 12.0-15.0 (g/dL) Final     HCT  Date Value Range Status  11/23/2010 30.3* 36.0-46.0 (%) Final    Discharge Diagnoses: Pre-Term Pregnancy-delivered, PIH  Discharge Information: Date: 11/25/2010 Activity: pelvic rest Diet: routine Medications:  Prior to Admission medications   Medication Sig Start Date End Date Taking? Authorizing Provider  prenatal vitamin w/FE, FA (PRENATAL 1 + 1) 27-1 MG TABS Take 1 tablet by mouth daily.     Yes Historical Provider, MD  ibuprofen (ADVIL,MOTRIN) 600 MG tablet Take 1 tablet (600 mg total) by mouth every 6 (six) hours as needed for pain (pain scale < 4). 11/25/10 12/05/10  Roseanna Rainbow, MD  labetalol (NORMODYNE) 200 MG tablet Take 1 tablet (200 mg total) by mouth 3 (three) times daily. 11/25/10 11/25/11  Roseanna Rainbow, MD  oxyCODONE-acetaminophen (PERCOCET) 5-325 MG per tablet Take 1-2 tablets by mouth every 3 (three) hours as needed (moderate - severe pain). 11/25/10 12/05/10  Roseanna Rainbow, MD    Condition: stable Instructions: refer to routine discharge instructions Discharge to: home Follow-up Information    Follow up with Antionette Char A, MD. Call in 2 days.   Contact information:   [address] [place] [address] [phone]          Newborn Data: Live born  Information for the patient's newborn:  Zelie, Asbill [[id number]]  female ; APGAR , ; weight ;  Home with mother.  [name],Nesbit Michon A 11/25/2010, 8:04 AM

## 2010-11-25 NOTE — Progress Notes (Signed)
Post Partum Day #2 S/P:induced vaginal  RH status/Rubella reviewed.  Feeding: unknown Subjective: No HA, SOB, CP, F/C, breast symptoms: no. Normal vaginal bleeding, no clots.     Objective:  Blood pressure 130/88, pulse 89, temperature 98.6 F (37 C), temperature source Oral, resp. rate 18, height 5\' 5"  (1.651 m), weight 67.677 kg (149 lb 3.2 oz), last menstrual period 03/14/2010, SpO2 98.00%, unknown if currently breastfeeding.   Physical Exam:  General: alert Lochia: appropriate Uterine Fundus: firm DVT Evaluation: No evidence of DVT seen on physical exam. Ext: No c/c/e  Basename 11/23/10 0533 11/22/10 1708  HGB 10.4* 12.0  HCT 30.3* 34.9*    Assessment/Plan: 25 y.o.  PPD # 2 .  normal postpartum exam patient is a candidate for Depo-Provera for contraception, with no contraindications BPs OK Continue current postpartum care D/C home   LOS: 7 days   [name],Beata Beason A 11/25/2010, 7:58 AM

## 2010-11-27 ENCOUNTER — Inpatient Hospital Stay (HOSPITAL_COMMUNITY)
Admission: AD | Admit: 2010-11-27 | Discharge: 2010-12-01 | DRG: 776 | Disposition: A | Discharge status: Home / Self Care | Payer: Medicaid Other | Source: Ambulatory Visit | Attending: Obstetrics | Admitting: Obstetrics

## 2010-11-27 DIAGNOSIS — O135 Gestational [pregnancy-induced] hypertension without significant proteinuria, complicating the puerperium: Principal | ICD-10-CM | POA: Diagnosis present

## 2010-11-27 DIAGNOSIS — O139 Gestational [pregnancy-induced] hypertension without significant proteinuria, unspecified trimester: Secondary | ICD-10-CM | POA: Diagnosis present

## 2010-11-27 DIAGNOSIS — K5900 Constipation, unspecified: Secondary | ICD-10-CM | POA: Diagnosis present

## 2010-11-27 DIAGNOSIS — O9279 Other disorders of lactation: Secondary | ICD-10-CM | POA: Diagnosis present

## 2010-11-27 LAB — COMPREHENSIVE METABOLIC PANEL
ALT: 18 U/L (ref 0–35)
AST: 27 U/L (ref 0–37)
Albumin: 3.5 g/dL (ref 3.5–5.2)
Alkaline Phosphatase: 118 U/L — ABNORMAL HIGH (ref 39–117)
BUN: 10 mg/dL (ref 6–23)
Creatinine, Ser: 0.66 mg/dL (ref 0.50–1.10)
GFR calc Af Amer: >90 mL/min (ref >90)
GFR calc non Af Amer: >90 mL/min (ref >90)
Total Protein: 7.8 g/dL (ref 6.0–8.3)

## 2010-11-27 LAB — CBC
HCT: 35.5 % — ABNORMAL LOW (ref 36.0–46.0)
MCH: 27.4 pg (ref 26.0–34.0)
MCHC: 34.1 g/dL (ref 30.0–36.0)
MCV: 80.3 fL (ref 78.0–100.0)
RDW: 13.7 % (ref 11.5–15.5)

## 2010-11-27 LAB — URINALYSIS, ROUTINE W REFLEX MICROSCOPIC
Bilirubin Urine: NEGATIVE
Glucose, UA: NEGATIVE mg/dL
Protein, ur: 30 mg/dL — AB
Urobilinogen, UA: 0.2 mg/dL (ref 0.0–1.0)

## 2010-11-27 LAB — LACTATE DEHYDROGENASE: LDH: 280 U/L — ABNORMAL HIGH (ref 94–250)

## 2010-11-27 LAB — URINE MICROSCOPIC-ADD ON

## 2010-11-27 MED ORDER — ONDANSETRON HCL 4 MG PO TABS: 4.0000 mg | ORAL_TABLET | ORAL | Status: DC | PRN

## 2010-11-27 MED ORDER — LACTATED RINGERS IV SOLN
INTRAVENOUS | Status: DC
  Administered 2010-11-27 – 2010-11-29 (×4): via INTRAVENOUS

## 2010-11-27 MED ORDER — LABETALOL HCL 5 MG/ML IV SOLN
20.0000 mg | Freq: Once | INTRAVENOUS | Status: AC
  Administered 2010-11-27: 40 mg via INTRAVENOUS
  Administered 2010-11-27: 20 mg via INTRAVENOUS

## 2010-11-27 MED ORDER — DIBUCAINE 1 % RE OINT
1.0000 "application " | TOPICAL_OINTMENT | RECTAL | Status: DC | PRN
  Filled 2010-11-27: qty 28

## 2010-11-27 MED ORDER — MAGNESIUM SULFATE 40 G IN LACTATED RINGERS - SIMPLE
2.0000 g/h | INTRAVENOUS | Status: AC
  Administered 2010-11-28: 2 g/h via INTRAVENOUS
  Filled 2010-11-27 (×2): qty 500

## 2010-11-27 MED ORDER — FERROUS SULFATE 325 (65 FE) MG PO TABS
325.0000 mg | ORAL_TABLET | Freq: Two times a day (BID) | ORAL | Status: DC
  Administered 2010-11-28 – 2010-12-01 (×7): 325 mg via ORAL
  Filled 2010-11-27 (×7): qty 1

## 2010-11-27 MED ORDER — WITCH HAZEL-GLYCERIN EX PADS: 1.0000 "application " | MEDICATED_PAD | CUTANEOUS | Status: DC | PRN

## 2010-11-27 MED ORDER — IBUPROFEN 600 MG PO TABS
600.0000 mg | ORAL_TABLET | Freq: Four times a day (QID) | ORAL | Status: DC
  Administered 2010-11-27 – 2010-12-01 (×14): 600 mg via ORAL
  Filled 2010-11-27 (×14): qty 1

## 2010-11-27 MED ORDER — MAGNESIUM SULFATE BOLUS VIA INFUSION
4.0000 g | Freq: Once | INTRAVENOUS | Status: AC
  Administered 2010-11-27: 4 g via INTRAVENOUS
  Filled 2010-11-27: qty 500

## 2010-11-27 MED ORDER — OXYCODONE-ACETAMINOPHEN 5-325 MG PO TABS
1.0000 | ORAL_TABLET | ORAL | Status: DC | PRN
  Administered 2010-11-27 – 2010-11-30 (×9): 2 via ORAL
  Administered 2010-11-30: 1 via ORAL
  Administered 2010-11-30 – 2010-12-01 (×2): 2 via ORAL
  Filled 2010-11-27 (×11): qty 2
  Filled 2010-11-27: qty 1

## 2010-11-27 MED ORDER — DIPHENHYDRAMINE HCL 25 MG PO CAPS: 25.0000 mg | ORAL_CAPSULE | Freq: Four times a day (QID) | ORAL | Status: DC | PRN

## 2010-11-27 MED ORDER — LABETALOL HCL 5 MG/ML IV SOLN
INTRAVENOUS | Status: AC
  Administered 2010-11-27: 40 mg via INTRAVENOUS
  Filled 2010-11-27: qty 4

## 2010-11-27 MED ORDER — BENZOCAINE-MENTHOL 20-0.5 % EX AERO
1.0000 "application " | INHALATION_SPRAY | CUTANEOUS | Status: DC | PRN
  Filled 2010-11-27: qty 56

## 2010-11-27 MED ORDER — ONDANSETRON HCL 4 MG/2ML IJ SOLN: 4.0000 mg | INTRAMUSCULAR | Status: DC | PRN

## 2010-11-27 MED ORDER — ZOLPIDEM TARTRATE 5 MG PO TABS: 5.0000 mg | ORAL_TABLET | Freq: Every evening | ORAL | Status: DC | PRN

## 2010-11-27 MED ORDER — LANOLIN HYDROUS EX OINT: TOPICAL_OINTMENT | CUTANEOUS | Status: DC | PRN

## 2010-11-27 MED ORDER — MAGNESIUM HYDROXIDE 400 MG/5ML PO SUSP: 30.0000 mL | ORAL | Status: DC | PRN

## 2010-11-27 MED ORDER — PRENATAL PLUS 27-1 MG PO TABS
1.0000 | ORAL_TABLET | Freq: Every day | ORAL | Status: DC
  Administered 2010-11-28 – 2010-12-01 (×4): 1 via ORAL
  Filled 2010-11-27 (×4): qty 1

## 2010-11-27 MED ORDER — SENNOSIDES-DOCUSATE SODIUM 8.6-50 MG PO TABS
2.0000 | ORAL_TABLET | Freq: Every day | ORAL | Status: DC
  Administered 2010-11-27 – 2010-11-30 (×4): 2 via ORAL

## 2010-11-27 NOTE — ED Provider Notes (Signed)
History     CSN: [id number] Arrival date & time: 11/27/2010  2:50 PM   None     HPI Sandra Lee is a 25 y.o. female  5 weeks post delivery presents to [facility]U for hypertension. She was admitted to the hospital last week for elevated blood pressure.  Discharged Monday 11/13 and had a follow up visit in the office today. Blood pressure elevated despite medication and patient told to come to MAU for admission. Patient denies headache, blurred vision or other symptoms.  Past Medical History  Diagnosis Date  . Heart murmur 25 yrs old    not sure if it is still present.  . Abnormal Pap smear   . Depression     hx of depression - not taking medication  . PIH (pregnancy induced hypertension), antepartum 11/19/2010    No past surgical history on file.  Family History  Problem Relation Age of Onset  . Depression Mother   . Early death Father   . Arthritis Sister   . Diabetes Maternal Aunt   . Early death Maternal Aunt   . Alcohol abuse Maternal Uncle   . Diabetes Maternal Uncle   . Diabetes Paternal Aunt   . Diabetes Paternal Uncle   . Arthritis Maternal Grandmother   . Diabetes Maternal Grandmother   . Heart disease Maternal Grandmother   . Stroke Maternal Grandmother   . Diabetes Maternal Grandfather     History  Substance Use Topics  . Smoking status: Former Smoker -- 0.5 packs/day    Types: Cigarettes    Quit date: 09/18/2010  . Smokeless tobacco: Not on file  . Alcohol Use: No    OB History    Grav Para Term Preterm Abortions TAB SAB Ect Mult Living   1 1 0 1 0 0 0 0 0 1       Review of Systems  Constitutional: Negative for fever, chills, diaphoresis and fatigue.  HENT: Negative for ear pain, congestion, sore throat, facial swelling, neck pain, neck stiffness, dental problem and sinus pressure.   Eyes: Negative for photophobia, pain and discharge.  Respiratory: Negative for cough, chest tightness and wheezing.   Cardiovascular: Negative.   Gastrointestinal:  Positive for constipation. Negative for nausea, vomiting, abdominal pain, diarrhea and abdominal distention.  Genitourinary: Positive for frequency, vaginal bleeding and vaginal discharge. Negative for dysuria, flank pain and difficulty urinating.  Musculoskeletal: Negative for myalgias, back pain and gait problem.  Skin: Negative for color change and rash.  Neurological: Negative for dizziness, speech difficulty, weakness, light-headedness, numbness and headaches.  Psychiatric/Behavioral: Negative for confusion and agitation. The patient is not nervous/anxious.     Allergies  Penicillins  Home Medications  No current outpatient prescriptions on file.  BP 178/109  Pulse 71  Temp(Src) 98.8 F (37.1 C) (Oral)  Resp 16  Ht 5\' 5"  (1.651 m)  Wt 149 lb 9.6 oz (67.858 kg)  BMI 24.89 kg/m2  SpO2 97%  LMP 03/14/2010  Breastfeeding? Unknown  Physical Exam  Nursing note and vitals reviewed. Constitutional: She is oriented to person, place, and time. She appears well-developed and well-nourished.  HENT:  Head: Normocephalic.  Eyes: EOM are normal.  Neck: Neck supple.  Cardiovascular: Normal rate.        Elevated blood pressure  Pulmonary/Chest: Effort normal.  Abdominal: Soft. There is no tenderness.  Musculoskeletal: Normal range of motion.  Neurological: She is alert and oriented to person, place, and time. No cranial nerve deficit.  Skin: Skin is warm and  dry.  Psychiatric: Her behavior is normal. Judgment and thought content normal. Her mood appears anxious.   Assessment:  Post partum PIH  Plan:  Consult with Dr. Tamela Oddi   Admission    Patient stable to go to unit for admission  ED Course  Procedures  MDM          [facility], NP 11/27/10 1647

## 2010-11-27 NOTE — Progress Notes (Signed)
Patient states she had a SVD on 11-9. Had gestational hypertension. Was seen in the office today for recheck and sent to MAU for evaluation of elevated BP.

## 2010-11-27 NOTE — H&P (Signed)
Subjective:   Patient is a 25 y.o. female presents with elevated BPs. She denies neurological symptoms.   Patient Active Problem List  Diagnoses Date Noted  . PIH (pregnancy induced hypertension) 11/27/2010   Past Medical History  Diagnosis Date  . Heart murmur 25 yrs old    not sure if it is still present.  . Abnormal Pap smear   . Depression     hx of depression - not taking medication  . PIH (pregnancy induced hypertension), antepartum 11/19/2010    No past surgical history on file.  Prescriptions prior to admission  Medication Sig Dispense Refill  . ibuprofen (ADVIL,MOTRIN) 600 MG tablet Take 1 tablet (600 mg total) by mouth every 6 (six) hours as needed for pain (pain scale < 4).  60 tablet  1  . labetalol (NORMODYNE) 200 MG tablet Take 1 tablet (200 mg total) by mouth 3 (three) times daily.  90 tablet  1  . oxyCODONE-acetaminophen (PERCOCET) 5-325 MG per tablet Take 1-2 tablets by mouth every 3 (three) hours as needed (moderate - severe pain).  30 tablet  0  . prenatal vitamin w/FE, FA (PRENATAL 1 + 1) 27-1 MG TABS Take 1 tablet by mouth daily.         Allergies  Allergen Reactions  . Penicillins Other (See Comments)    Childhood reaction patient unsure of allergic response    History  Substance Use Topics  . Smoking status: Former Smoker -- 0.5 packs/day    Types: Cigarettes    Quit date: 09/18/2010  . Smokeless tobacco: Not on file  . Alcohol Use: No    Family History  Problem Relation Age of Onset  . Depression Mother   . Early death Father   . Arthritis Sister   . Diabetes Maternal Aunt   . Early death Maternal Aunt   . Alcohol abuse Maternal Uncle   . Diabetes Maternal Uncle   . Diabetes Paternal Aunt   . Diabetes Paternal Uncle   . Arthritis Maternal Grandmother   . Diabetes Maternal Grandmother   . Heart disease Maternal Grandmother   . Stroke Maternal Grandmother   . Diabetes Maternal Grandfather     Review of Systems Neurological:  negative  Objective:   Patient Vitals for the past 8 hrs:  BP Temp Temp src Pulse Resp SpO2 Height Weight  11/27/10 2102 146/84 mmHg - - 69  18  - - -  11/27/10 2038 - - - - 18  - - -  11/27/10 2008 137/94 mmHg 97.3 F (36.3 C) - 80  18  - - -  11/27/10 1911 146/84 mmHg 97.3 F (36.3 C) - 72  18  - - -  11/27/10 1900 145/91 mmHg - - 68  18  - - -  11/27/10 1849 139/92 mmHg - - 65  18  - - -  11/27/10 1841 156/106 mmHg 98.3 F (36.8 C) - 62  18  - - -  11/27/10 1835 156/96 mmHg - - 66  18  - - -  11/27/10 1803 148/88 mmHg - - 71  16  - - -  11/27/10 1749 139/85 mmHg - - 70  16  - - -  11/27/10 1736 164/91 mmHg - - 74  16  - - -  11/27/10 1720 162/90 mmHg - - 75  16  - - -  11/27/10 1645 180/99 mmHg - - 76  16  - - -  11/27/10 1625 162/92 mmHg - - 66  16  - - -  11/27/10 1610 183/106 mmHg - - 73  16  - - -  11/27/10 1550 178/109 mmHg - - 71  16  - - -  11/27/10 1534 169/102 mmHg - - 66  16  - - -  11/27/10 1520 182/109 mmHg - - 74  16  - - -  11/27/10 1458 167/102 mmHg 98.8 F (37.1 C) Oral 76  16  97 % 5\' 5"  (1.651 m) 67.858 kg (149 lb 9.6 oz)   I/O last 3 completed shifts: In: 305 [I.V.:305] Out: 0  Total I/O In: 510.8 [P.O.:240; I.V.:270.8] Out: 450 [Urine:450]    BP 146/84  Pulse 69  Temp(Src) 97.3 F (36.3 C) (Oral)  Resp 18  Ht 5\' 5"  (1.651 m)  Wt 67.858 kg (149 lb 9.6 oz)  BMI 24.89 kg/m2  SpO2 97%  LMP 03/14/2010  Breastfeeding? Unknown  General Appearance:    Alert, cooperative, no distress, appears stated age  Head:    Normocephalic, without obvious abnormality, atraumatic  Eyes:    PERRL, conjunctiva/corneas                  Neck:   Supple,   Back:     Symmetric, no curvature, ROM normal, no CVA tenderness  Lungs:     Clear to auscultation bilaterally, respirations unlabored  Chest Wall:    No tenderness or deformity   Heart:    Regular rate and rhythm, S1 and S2 normal, no murmur, rub   or gallop     Abdomen:     Soft, non-tender, bowel  sounds active all four quadrants,    no masses, no organomegaly        Extremities:   Extremities normal, atraumatic, no cyanosis or edema  Pulses:   2+ and symmetric all extremities  Skin:   Skin color, texture, turgor normal, no rashes or lesions     Neurologic:   Nonfocal.       Assessment:   Active Problems:  PIH (pregnancy induced hypertension)   Plan:   Admit Bedrest Magnesium sulfate for seizure prophylaxis Antihypertensives

## 2010-11-28 MED ORDER — LABETALOL HCL 200 MG PO TABS
200.0000 mg | ORAL_TABLET | Freq: Three times a day (TID) | ORAL | Status: DC
  Administered 2010-11-28 – 2010-11-29 (×4): 200 mg via ORAL
  Filled 2010-11-28 (×4): qty 1

## 2010-11-28 NOTE — Progress Notes (Signed)
Subjective: Interval History: c/o perineal pain  Objective: Vital signs in last 24 hours: Temp:  [97.3 F (36.3 C)-98.8 F (37.1 C)] 97.5 F (36.4 C) (11/15 1201) Pulse Rate:  [62-80] 69  (11/15 1201) Resp:  [16-20] 18  (11/15 1201) BP: (120-183)/(66-109) 127/88 mmHg (11/15 1201) SpO2:  [97 %] 97 % (11/14 1458) Weight:  [67.858 kg (149 lb 9.6 oz)] 149 lb 9.6 oz (67.858 kg) (11/14 1458)  Intake/Output from previous day: 11/14 0701 - 11/15 0700 In: 2413.3 [P.O.:600; I.V.:1813.3] Out: 1225 [Urine:1225] Intake/Output this shift: Total I/O In: 1225 [P.O.:600; I.V.:625] Out: 0   BP 127/88  Pulse 69  Temp(Src) 97.5 F (36.4 C) (Oral)  Resp 18  Ht 5\' 5"  (1.651 m)  Wt 67.858 kg (149 lb 9.6 oz)  BMI 24.89 kg/m2  SpO2 97%  LMP 03/14/2010  Breastfeeding? Unknown General appearance: alert Extremities: extremities normal, atraumatic, no cyanosis or edema  Results for orders placed during the hospital encounter of 11/27/10 (from the past 24 hour(s))  URINALYSIS, ROUTINE W REFLEX MICROSCOPIC     Status: Abnormal   Collection Time   11/27/10  4:13 PM      Component Value Range   Color, Urine YELLOW  YELLOW    Appearance CLEAR  CLEAR    Specific Gravity, Urine 1.020  1.005 - 1.030    pH 6.0  5.0 - 8.0    Glucose, UA NEGATIVE  NEGATIVE (mg/dL)   Hgb urine dipstick LARGE (*) NEGATIVE    Bilirubin Urine NEGATIVE  NEGATIVE    Ketones, ur NEGATIVE  NEGATIVE (mg/dL)   Protein, ur 30 (*) NEGATIVE (mg/dL)   Urobilinogen, UA 0.2  0.0 - 1.0 (mg/dL)   Nitrite NEGATIVE  NEGATIVE    Leukocytes, UA SMALL (*) NEGATIVE   URINE MICROSCOPIC-ADD ON     Status: Abnormal   Collection Time   11/27/10  4:13 PM      Component Value Range   Squamous Epithelial / LPF FEW (*) RARE    WBC, UA 7-10  <3 (WBC/hpf)   RBC / HPF 11-20  <3 (RBC/hpf)   Bacteria, UA FEW (*) RARE   CBC     Status: Abnormal   Collection Time   11/27/10  4:18 PM      Component Value Range   WBC 8.7  4.0 - 10.5 (K/uL)   RBC 4.42  3.87 - 5.11 (MIL/uL)   Hemoglobin 12.1  12.0 - 15.0 (g/dL)   HCT 40.9 (*) 81.1 - 46.0 (%)   MCV 80.3  78.0 - 100.0 (fL)   MCH 27.4  26.0 - 34.0 (pg)   MCHC 34.1  30.0 - 36.0 (g/dL)   RDW 91.4  78.2 - 95.6 (%)   Platelets 230  150 - 400 (K/uL)  COMPREHENSIVE METABOLIC PANEL     Status: Abnormal   Collection Time   11/27/10  4:18 PM      Component Value Range   Sodium 136  135 - 145 (mEq/L)   Potassium 3.8  3.5 - 5.1 (mEq/L)   Chloride 100  96 - 112 (mEq/L)   CO2 26  19 - 32 (mEq/L)   Glucose, Bld 75  70 - 99 (mg/dL)   BUN 10  6 - 23 (mg/dL)   Creatinine, Ser 2.13  0.50 - 1.10 (mg/dL)   Calcium 08.6 (*) 8.4 - 10.5 (mg/dL)   Total Protein 7.8  6.0 - 8.3 (g/dL)   Albumin 3.5  3.5 - 5.2 (g/dL)   AST 27  0 - 37 (U/L)   ALT 18  0 - 35 (U/L)   Alkaline Phosphatase 118 (*) 39 - 117 (U/L)   Total Bilirubin 0.3  0.3 - 1.2 (mg/dL)   GFR calc non Af Amer >90  >90 (mL/min)   GFR calc Af Amer >90  >90 (mL/min)  URIC ACID     Status: Normal   Collection Time   11/27/10  4:18 PM      Component Value Range   Uric Acid, Serum 5.4  2.4 - 7.0 (mg/dL)  LACTATE DEHYDROGENASE     Status: Abnormal   Collection Time   11/27/10  4:18 PM      Component Value Range   LD 280 (*) 94 - 250 (U/L)   .    Scheduled Meds:   . ferrous sulfate  325 mg Oral BID WC  . ibuprofen  600 mg Oral Q6H  . labetalol  200 mg Oral TID  . labetalol  20 mg Intravenous Once  . magnesium  4 g Intravenous Once  . prenatal vitamin w/FE, FA  1 tablet Oral Daily  . senna-docusate  2 tablet Oral QHS   Continuous Infusions:   . lactated ringers 75 mL/hr at 11/28/10 0330  . magnesium sulfate 40 grams in LR 500 mL 2 g/hr (11/27/10 1910)   PRN Meds:benzocaine-Menthol, dibucaine, diphenhydrAMINE, lanolin, magnesium hydroxide, ondansetron (ZOFRAN) IV, ondansetron, oxyCODONE-acetaminophen, witch hazel-glycerin, zolpidem  Assessment/Plan: PIH BPs in range  Continue MgSO4, bedrest    LOS: 1 day    [name],Johany Hansman A

## 2010-11-29 MED ORDER — BACITRACIN-NEOMYCIN-POLYMYXIN OINTMENT TUBE
TOPICAL_OINTMENT | Freq: Three times a day (TID) | CUTANEOUS | Status: DC
  Administered 2010-11-29 – 2010-12-01 (×7): via TOPICAL
  Filled 2010-11-29: qty 15

## 2010-11-29 MED ORDER — LABETALOL HCL 100 MG PO TABS
100.0000 mg | ORAL_TABLET | Freq: Once | ORAL | Status: AC
  Administered 2010-11-29: 100 mg via ORAL
  Filled 2010-11-29: qty 1

## 2010-11-29 MED ORDER — LABETALOL HCL 300 MG PO TABS
300.0000 mg | ORAL_TABLET | Freq: Three times a day (TID) | ORAL | Status: DC
  Administered 2010-11-29 – 2010-12-01 (×6): 300 mg via ORAL
  Filled 2010-11-29 (×6): qty 1

## 2010-11-29 MED ORDER — POLYETHYLENE GLYCOL 3350 17 G PO PACK
17.0000 g | PACK | Freq: Every day | ORAL | Status: DC
  Administered 2010-11-29 – 2010-12-01 (×2): 17 g via ORAL
  Filled 2010-11-29 (×3): qty 1

## 2010-11-29 NOTE — Progress Notes (Signed)
UR Chart review completed.  

## 2010-11-29 NOTE — Progress Notes (Addendum)
Postpartum day 7, readmitted for Gestational HTN. Subjective: voiding, tolerating PO and having significant perineal pain due to left labial laceration. magnesium dc this am at 0600, BPs remain labile. Pt denies HA, blurred vision, RUQ pain or dizziness. Pt is experiencing mild engorgement, is bottlefeeding infant. No bowel movement since delivery.  Objective: Blood pressure 165/92, pulse 81, temperature 98.4 F (36.9 C), temperature source Oral, resp. rate 18, height 5\' 5"  (1.651 m), weight 67.858 kg (149 lb 9.6 oz), last menstrual period 03/14/2010, SpO2 97.00%, unknown if currently breastfeeding.  Physical Exam:  General: alert, cooperative, appears stated age and no distress, breasts diffusely firm. Bowel sounds  x 4 quads.  Left Labia: small 1cm laceration noted, unrepaired, no redness, no swelling.   Lochia: appropriate Uterine Fundus: firm DVT Evaluation: No evidence of DVT seen on physical exam. Negative Homan's sign. No cords or calf tenderness. No significant calf/ankle edema.   Basename 11/27/10 1618  HGB 12.1  HCT 35.5*    Assessment/Plan: Plan for discharge tomorrow if BPs stabilize, and diuresis improves. Miralax for constipation, cabbage leaves for engorgement. Remain on bedrest with BRP. Continue with pain mgmt and Sitz baths for labial pain. Discussed plan of care with Dr. Tamela Oddi, increase Labetalol to 300mg  PO TID and topical antibiotic ointment to labial laceration.    LOS: 2 days   Anice Paganini CNM 11/29/2010, 8:36 AM

## 2010-11-30 MED ORDER — LABETALOL HCL 5 MG/ML IV SOLN
40.0000 mg | Freq: Once | INTRAVENOUS | Status: AC | PRN
  Administered 2010-11-30: 40 mg via INTRAVENOUS
  Filled 2010-11-30 (×2): qty 4

## 2010-11-30 MED ORDER — LABETALOL HCL 5 MG/ML IV SOLN: 80.0000 mg | INTRAVENOUS | Status: DC | PRN

## 2010-11-30 MED ORDER — LABETALOL HCL 5 MG/ML IV SOLN
20.0000 mg | Freq: Once | INTRAVENOUS | Status: AC | PRN
  Administered 2010-11-30: 20 mg via INTRAVENOUS
  Filled 2010-11-30: qty 4

## 2010-11-30 MED ORDER — AMLODIPINE BESYLATE 5 MG PO TABS
5.0000 mg | ORAL_TABLET | Freq: Every day | ORAL | Status: DC
  Administered 2010-11-30 – 2010-12-01 (×2): 5 mg via ORAL
  Filled 2010-11-30 (×2): qty 1

## 2010-11-30 NOTE — Progress Notes (Signed)
Pt. Walking around the room, working on computer, ready for a.m. ADLs. Glove placed on right hand - pt. Desires to shower. No c/o of headache, blurred vision, or abd. Discomfort.

## 2010-11-30 NOTE — Progress Notes (Signed)
BP 5 min. after Labetalol given, 20 mg.

## 2010-11-30 NOTE — Progress Notes (Signed)
Informed of increased BP - no new orders received. Currently making rounds - will be around to see pt. Shortly.

## 2010-11-30 NOTE — Progress Notes (Signed)
  Subjective: Interval History: none.  Objective: Vital signs in last 24 hours: Temp:  [97.6 F (36.4 C)-99 F (37.2 C)] 98.8 F (37.1 C) (11/17 1156) Pulse Rate:  [61-81] 70  (11/17 1431) Resp:  [18] 18  (11/17 1320) BP: (124-180)/(72-111) 168/108 mmHg (11/17 1431)  Intake/Output from previous day:   Intake/Output this shift:    BP 168/108  Pulse 70  Temp(Src) 98.8 F (37.1 C) (Oral)  Resp 18  Ht 5\' 5"  (1.651 m)  Wt 67.858 kg (149 lb 9.6 oz)  BMI 24.89 kg/m2  SpO2 97%  LMP 03/14/2010  Breastfeeding? Unknown  General Appearance:    Alert, cooperative, no distress, appears stated age  Head:    Normocephalic, without obvious abnormality, atraumatic                                Abdomen:     Soft, non-tender, bowel sounds active all four quadrants,    no masses, no organomegaly        Extremities:   Extremities normal, atraumatic, no cyanosis or edema     Skin:   Skin color, texture, turgor normal, no rashes or lesions     Neurologic:   Nonfocal    No results found for this or any previous visit (from the past 24 hour(s)).  Studies/Results: US Ob Comp + 14 Wk  11/19/2010  OBSTETRICAL ULTRASOUND: This exam was performed within [facility]. The OB US report was generated in the AS system, and faxed to the ordering physician.   This report is also available in [employer] and in the [employer]. See AS Obstetric US report.   [place] U[place] Cord Doppler  11/20/2010  OBSTETRICAL ULTRASOUND: This exam was performed within [facility]. The OB US report was generated in the AS system, and faxed to the ordering physician.   This report is also available in [employer] and in the [employer]. See AS Obstetric US report.    Scheduled Meds:   . ferrous sulfate  325 mg Oral BID WC  . ibuprofen  600 mg Oral Q6H  . labetalol  300 mg Oral TID  . neomycin-bacitracin-polymyxin   Topical  TID  . polyethylene glycol  17 g Oral Daily  . prenatal vitamin w/FE, FA  1 tablet Oral Daily  . senna-docusate  2 tablet Oral QHS   Continuous Infusions:  PRN Meds:benzocaine-Menthol, dibucaine, diphenhydrAMINE, labetalol, labetalol, labetalol, lanolin, magnesium hydroxide, ondansetron (ZOFRAN) IV, ondansetron, oxyCODONE-acetaminophen, witch hazel-glycerin, zolpidem  Assessment/Plan: PIH.  BPs remain labile  Add Norvasc    LOS: 3 days   [name],Sandra Lee

## 2010-12-01 MED ORDER — LABETALOL HCL 300 MG PO TABS: 300.0000 mg | ORAL_TABLET | Freq: Three times a day (TID) | ORAL | Status: DC

## 2010-12-01 MED ORDER — AMLODIPINE BESYLATE 5 MG PO TABS: 5.0000 mg | ORAL_TABLET | Freq: Every day | ORAL | Status: DC

## 2010-12-01 NOTE — Progress Notes (Signed)
Dr. Tamela Oddi at the bedside for a.m. Evaluation - orders received - discharge patient.

## 2010-12-01 NOTE — Progress Notes (Signed)
Discharge instructions reviewed with patient, copy given, patient verbalized understanding.  Prescriptions given. Pt.'s grandmother with pt's infant son to the bedside to pick her up - home. Pt. Asymptomatic, no c/o visual changes, headache, or abd. Discomfort. Pt. excited about discharge.

## 2010-12-01 NOTE — Progress Notes (Signed)
Subjective: Interval History: none.  Objective: Vital signs in last 24 hours: Temp:  [97.8 F (36.6 C)-98.8 F (37.1 C)] 98.2 F (36.8 C) (11/18 0749) Pulse Rate:  [65-81] 65  (11/18 0749) Resp:  [18] 18  (11/18 0749) BP: (130-176)/(74-111) 142/92 mmHg (11/18 0749)      General appearance: alert Abdomen: soft, non-tender; bowel sounds normal; no masses,  no organomegaly Extremities: extremities normal, atraumatic, no cyanosis or edema  No results found for this or any previous visit (from the past 24 hour(s)).    Scheduled Meds:   . amLODipine  5 mg Oral Daily  . ferrous sulfate  325 mg Oral BID WC  . ibuprofen  600 mg Oral Q6H  . labetalol  300 mg Oral TID  . neomycin-bacitracin-polymyxin   Topical TID  . polyethylene glycol  17 g Oral Daily  . prenatal vitamin w/FE, FA  1 tablet Oral Daily  . senna-docusate  2 tablet Oral QHS   Continuous Infusions:  PRN Meds:benzocaine-Menthol, dibucaine, diphenhydrAMINE, labetalol, labetalol, labetalol, lanolin, magnesium hydroxide, ondansetron (ZOFRAN) IV, ondansetron, oxyCODONE-acetaminophen, witch hazel-glycerin, zolpidem  Assessment/Plan: PIH BPs better controlled on current regimen  OK for D/C home    LOS: 4 days   [name],Uvaldo Rybacki A

## 2010-12-01 NOTE — Discharge Summary (Signed)
  Physician Discharge Summary  Patient ID: Sandra Lee MRN: [id number] DOB/AGE: 1985-05-10 25 y.o.  Admit date: 11/27/2010 Discharge date: 12/01/2010 Active Problems:  PIH (pregnancy induced hypertension)  Admission Diagnoses:   Discharge Diagnoses:  Active Problems:  PIH (pregnancy induced hypertension)   Discharged Condition: stable  Hospital Course: The patient was started on parenteral MgSO4 for seizure prophylaxis.  This was continued for 24 - 48 hours.  The outpatient oral dose of Labetalol was titrated.  Norvasc was added to the regimen.  IV Labetalol was given in boluses for severe blood pressure elevations.  The blood pressures were controlled upon discharge to home.  Consults: none  Significant Diagnostic Studies: labs: PIH panel  Treatments: see above  Discharge Exam: Blood pressure 142/92, pulse 65, temperature 98.2 F (36.8 C), temperature source Oral, resp. rate 18, height 5\' 5"  (1.651 m), weight 67.858 kg (149 lb 9.6 oz), last menstrual period 03/14/2010, SpO2 97.00%, unknown if currently breastfeeding. General appearance: alert GI: soft, non-tender; bowel sounds normal; no masses,  no organomegaly Extremities: extremities normal, atraumatic, no cyanosis or edema  Disposition: Home or Self Care  Discharge Orders    Future Orders Please Complete By Expires   Diet - low sodium heart healthy      Increase activity slowly      Sexual Activity Restrictions      Comments:   No intercourse x 4 weeks   Call MD for:  difficulty breathing, headache or visual disturbances        Current Discharge Medication List    START taking these medications   Details  amLODipine (NORVASC) 5 MG tablet Take 1 tablet (5 mg total) by mouth daily. Qty: 30 tablet, Refills: 3      CONTINUE these medications which have CHANGED   Details  labetalol (NORMODYNE) 300 MG tablet Take 1 tablet (300 mg total) by mouth 3 (three) times daily. Qty: 90 tablet, Refills: 2        CONTINUE these medications which have NOT CHANGED   Details  ibuprofen (ADVIL,MOTRIN) 600 MG tablet Take 1 tablet (600 mg total) by mouth every 6 (six) hours as needed for pain (pain scale < 4). Qty: 60 tablet, Refills: 1    oxyCODONE-acetaminophen (PERCOCET) 5-325 MG per tablet Take 1-2 tablets by mouth every 3 (three) hours as needed (moderate - severe pain). Qty: 30 tablet, Refills: 0    prenatal vitamin w/FE, FA (PRENATAL 1 + 1) 27-1 MG TABS Take 1 tablet by mouth daily.         Follow-up Information    Follow up with Roseanna Rainbow, MD. Make an appointment in 2 days.   Contact information:   [address], [address] [place] [address] [phone]          Signed: Roseanna Rainbow 12/01/2010, 10:32 AM

## 2012-11-28 ENCOUNTER — Emergency Department (HOSPITAL_COMMUNITY)
Admission: EM | Admit: 2012-11-28 | Discharge: 2012-11-28 | Disposition: A | Discharge status: Home / Self Care | Payer: Medicaid Other | Source: Home / Self Care

## 2012-11-28 ENCOUNTER — Other Ambulatory Visit (HOSPITAL_COMMUNITY)
Admission: RE | Admit: 2012-11-28 | Discharge: 2012-11-28 | Disposition: A | Discharge status: Home / Self Care | Payer: Medicaid Other | Source: Ambulatory Visit | Attending: Family Medicine | Admitting: Family Medicine

## 2012-11-28 ENCOUNTER — Encounter (HOSPITAL_COMMUNITY): Payer: Self-pay | Admitting: Emergency Medicine

## 2012-11-28 DIAGNOSIS — N760 Acute vaginitis: Secondary | ICD-10-CM | POA: Insufficient documentation

## 2012-11-28 DIAGNOSIS — A499 Bacterial infection, unspecified: Secondary | ICD-10-CM

## 2012-11-28 DIAGNOSIS — B9689 Other specified bacterial agents as the cause of diseases classified elsewhere: Secondary | ICD-10-CM

## 2012-11-28 DIAGNOSIS — R12 Heartburn: Secondary | ICD-10-CM

## 2012-11-28 LAB — POCT URINALYSIS DIP (DEVICE)
Glucose, UA: NEGATIVE mg/dL
Ketones, ur: NEGATIVE mg/dL
Specific Gravity, Urine: >=1.03 (ref 1.005–1.030)
Urobilinogen, UA: 0.2 mg/dL (ref 0.0–1.0)
pH: 6.5 (ref 5.0–8.0)

## 2012-11-28 LAB — POCT PREGNANCY, URINE: Preg Test, Ur: NEGATIVE

## 2012-11-28 MED ORDER — RANITIDINE HCL 150 MG PO TABS: 150.0000 mg | ORAL_TABLET | Freq: Every day | ORAL | Status: DC

## 2012-11-28 MED ORDER — METRONIDAZOLE 500 MG PO TABS: 500.0000 mg | ORAL_TABLET | Freq: Two times a day (BID) | ORAL | Status: DC

## 2012-11-28 NOTE — ED Notes (Signed)
Multiple complaints: indigestion , gas, heartburn, epigastric pain.  Patient also reports vaginal discharge, odor, spotting onset Friday.

## 2012-11-28 NOTE — ED Provider Notes (Signed)
CSN: [id number]     Arrival date & time 11/28/12  1243 History   None    Chief Complaint  Patient presents with  . Gastrophageal Reflux  . Vaginal Discharge   (Consider location/radiation/quality/duration/timing/severity/associated sxs/prior Treatment) HPI Comments: Patient presents with 2 co's today. 1st she has been having 2-3 days of "gas" and "belching". No abdominal pain. No fever or chills. No CP or SOB. She has not tried anything otc just yet. 2nd she is having a malodorous discharge. Mild irritation, no itching. No exposure to STD and no chance pregnant. Menses are normal, and she is spotting today.   Patient is a 27 y.o. female presenting with GERD and vaginal discharge. The history is provided by the patient.  Gastrophageal Reflux  Vaginal Discharge   Past Medical History  Diagnosis Date  . Heart murmur 27 yrs old    not sure if it is still present.  . Abnormal Pap smear   . Depression     hx of depression - not taking medication  . PIH (pregnancy induced hypertension), antepartum 11/19/2010   History reviewed. No pertinent past surgical history. Family History  Problem Relation Age of Onset  . Depression Mother   . Early death Father   . Arthritis Sister   . Diabetes Maternal Aunt   . Early death Maternal Aunt   . Alcohol abuse Maternal Uncle   . Diabetes Maternal Uncle   . Diabetes Paternal Aunt   . Diabetes Paternal Uncle   . Arthritis Maternal Grandmother   . Diabetes Maternal Grandmother   . Heart disease Maternal Grandmother   . Stroke Maternal Grandmother   . Diabetes Maternal Grandfather    History  Substance Use Topics  . Smoking status: Former Smoker -- 0.50 packs/day    Types: Cigarettes    Quit date: 09/18/2010  . Smokeless tobacco: Not on file  . Alcohol Use: No   OB History   Grav Para Term Preterm Abortions TAB SAB Ect Mult Living   1 1 0 1 0 0 0 0 0 1      Review of Systems  Genitourinary: Positive for vaginal discharge.  All other  systems reviewed and are negative.    Allergies  Penicillins  Home Medications   Current Outpatient Rx  Name  Route  Sig  Dispense  Refill  . EXPIRED: amLODipine (NORVASC) 5 MG tablet   Oral   Take 1 tablet (5 mg total) by mouth daily.   30 tablet   3   . EXPIRED: labetalol (NORMODYNE) 300 MG tablet   Oral   Take 1 tablet (300 mg total) by mouth 3 (three) times daily.   90 tablet   2   . metroNIDAZOLE (FLAGYL) 500 MG tablet   Oral   Take 1 tablet (500 mg total) by mouth 2 (two) times daily.   14 tablet   0   . prenatal vitamin w/FE, FA (PRENATAL 1 + 1) 27-1 MG TABS   Oral   Take 1 tablet by mouth daily.           . ranitidine (ZANTAC) 150 MG tablet   Oral   Take 1 tablet (150 mg total) by mouth at bedtime.   60 tablet   0    BP 131/89  Pulse 72  Temp(Src) 99.1 F (37.3 C) (Oral)  Resp 18  SpO2 99%  LMP 11/28/2012  Breastfeeding? No Physical Exam  Nursing note and vitals reviewed. Constitutional: She appears well-developed and well-nourished. No distress.  HENT:  Head: Normocephalic and atraumatic.  Cardiovascular: Normal rate and regular rhythm.   Pulmonary/Chest: Effort normal and breath sounds normal.  Abdominal: Bowel sounds are normal. She exhibits no distension and no mass. There is no tenderness. There is no rebound and no guarding.  Genitourinary: Vagina normal.  Mild grayish discharge, no lesions are noted.  Neurological: She is alert. No cranial nerve deficit.  Skin: Skin is warm and dry. She is not diaphoretic.    ED Course  Procedures (including critical care time) Labs Review Labs Reviewed  CERVICOVAGINAL ANCILLARY ONLY   Imaging Review No results found.  EKG Interpretation     Ventricular Rate:    PR Interval:    QRS Duration:   QT Interval:    QTC Calculation:   R Axis:     Text Interpretation:              MDM   1. Heartburn   2. Bacterial vaginitis   1. Treat empirically with Zantac as needed. No  indication of acute abd.  2. Treat empirically with Flagyl. Run affirm and call if concerns.     Riki Sheer, PA-C 11/28/12 [phone]

## 2012-11-29 NOTE — ED Provider Notes (Signed)
Medical screening examination/treatment/procedure(s) were performed by resident physician or non-physician practitioner and as supervising physician I was immediately available for consultation/collaboration.   Barkley Bruns MD.   Linna Hoff, MD 11/29/12 2100

## 2013-06-06 ENCOUNTER — Emergency Department (HOSPITAL_COMMUNITY)
Admission: EM | Admit: 2013-06-06 | Discharge: 2013-06-06 | Disposition: A | Discharge status: Home / Self Care | Payer: Medicaid Other | Source: Home / Self Care | Attending: Family Medicine | Admitting: Family Medicine

## 2013-06-06 ENCOUNTER — Encounter (HOSPITAL_COMMUNITY): Payer: Self-pay | Admitting: Emergency Medicine

## 2013-06-06 DIAGNOSIS — N390 Urinary tract infection, site not specified: Secondary | ICD-10-CM

## 2013-06-06 DIAGNOSIS — L259 Unspecified contact dermatitis, unspecified cause: Secondary | ICD-10-CM

## 2013-06-06 DIAGNOSIS — L309 Dermatitis, unspecified: Secondary | ICD-10-CM

## 2013-06-06 LAB — POCT URINALYSIS DIP (DEVICE)
Bilirubin Urine: NEGATIVE
Glucose, UA: NEGATIVE mg/dL
Hgb urine dipstick: NEGATIVE
Ketones, ur: NEGATIVE mg/dL
NITRITE: NEGATIVE
PH: 7 (ref 5.0–8.0)
PROTEIN: NEGATIVE mg/dL
Specific Gravity, Urine: 1.02 (ref 1.005–1.030)
UROBILINOGEN UA: 0.2 mg/dL (ref 0.0–1.0)

## 2013-06-06 MED ORDER — CIPROFLOXACIN HCL 500 MG PO TABS: 500.0000 mg | ORAL_TABLET | Freq: Two times a day (BID) | ORAL | Status: DC

## 2013-06-06 MED ORDER — TRIAMCINOLONE ACETONIDE 0.1 % EX CREA: 1.0000 "application " | TOPICAL_CREAM | Freq: Two times a day (BID) | CUTANEOUS | Status: DC

## 2013-06-06 NOTE — ED Provider Notes (Signed)
Sandra Lee is a 28 y.o. female who presents to Urgent Care today for urinary frequency urgency and dysuria present for 3 days. No abdominal or back pain. No fevers chills nausea vomiting or diarrhea. Patient additionally notes an itchy rash on her flexor elbow this and posterior neck. Rash present for a few weeks. No fever chills nausea vomiting or diarrhea. No new perfumes detergents or soaps. Many medications. She feels well.   Past Medical History  Diagnosis Date  . Heart murmur 28 yrs old    not sure if it is still present.  . Abnormal Pap smear   . Depression     hx of depression - not taking medication  . PIH (pregnancy induced hypertension), antepartum 11/19/2010   History  Substance Use Topics  . Smoking status: Former Smoker -- 0.50 packs/day    Types: Cigarettes    Quit date: 09/18/2010  . Smokeless tobacco: Not on file  . Alcohol Use: No   ROS as above Medications: No current facility-administered medications for this encounter.   Current Outpatient Prescriptions  Medication Sig Dispense Refill  . ranitidine (ZANTAC) 75 MG tablet Take 75 mg by mouth 2 (two) times daily.      Marland Kitchen amLODipine (NORVASC) 5 MG tablet Take 1 tablet (5 mg total) by mouth daily.  30 tablet  3  . ciprofloxacin (CIPRO) 500 MG tablet Take 1 tablet (500 mg total) by mouth 2 (two) times daily.  14 tablet  0  . etonogestrel (NEXPLANON) 68 MG IMPL implant Inject 1 each into the skin once.      . labetalol (NORMODYNE) 300 MG tablet Take 1 tablet (300 mg total) by mouth 3 (three) times daily.  90 tablet  2  . triamcinolone cream (KENALOG) 0.1 % Apply 1 application topically 2 (two) times daily. Until skin looks normal  30 g  1    Exam:  BP 135/84  Pulse 97  Temp(Src) 98.2 F (36.8 C) (Oral)  Resp 12  SpO2 100%  LMP 06/01/2013 Gen: Well NAD HEENT: EOMI,  MMM Lungs: Normal work of breathing. CTABL Heart: RRR no MRG Abd: NABS, Soft. NT, ND no CV angle tenderness to percussion Exts: Brisk  capillary refill, warm and well perfused.  Skin: Erythematous maculopapular rash bilateral elbow flexors and posterior neck  Results for orders placed during the hospital encounter of 06/06/13 (from the past 24 hour(s))  POCT URINALYSIS DIP (DEVICE)     Status: Abnormal   Collection Time    06/06/13  8:25 PM      Result Value Ref Range   Glucose, UA NEGATIVE  NEGATIVE mg/dL   Bilirubin Urine NEGATIVE  NEGATIVE   Ketones, ur NEGATIVE  NEGATIVE mg/dL   Specific Gravity, Urine 1.020  1.005 - 1.030   Hgb urine dipstick NEGATIVE  NEGATIVE   pH 7.0  5.0 - 8.0   Protein, ur NEGATIVE  NEGATIVE mg/dL   Urobilinogen, UA 0.2  0.0 - 1.0 mg/dL   Nitrite NEGATIVE  NEGATIVE   Leukocytes, UA MODERATE (*) NEGATIVE   No results found.  Assessment and Plan: 28 y.o. female with  1) UTI. Treatment with Cipro as patient is allergic to penicillin. Urine culture pending.Marland Kitchen 2) rash. Likely eczema versus contact dermatitis. Triamcinolone cream.  Discussed warning signs or symptoms. Please see discharge instructions. Patient expresses understanding.    Rodolph Bong, MD 06/06/13 [phone]

## 2013-06-06 NOTE — ED Notes (Signed)
C/o burning with urination onset Friday.  C/o urinary frequency in small amounts.  Rash on neck and antecubital area onset yesterday with itching.

## 2013-06-06 NOTE — Discharge Instructions (Signed)
Thank you for coming in today. If your belly pain worsens, or you have high fever, bad vomiting, blood in your stool or black tarry stool go to the Emergency Room.    Urinary Tract Infection Urinary tract infections (UTIs) can develop anywhere along your urinary tract. Your urinary tract is your body's drainage system for removing wastes and extra water. Your urinary tract includes two kidneys, two ureters, a bladder, and a urethra. Your kidneys are a pair of bean-shaped organs. Each kidney is about the size of your fist. They are located below your ribs, one on each side of your spine. CAUSES Infections are caused by microbes, which are microscopic organisms, including fungi, viruses, and bacteria. These organisms are so small that they can only be seen through a microscope. Bacteria are the microbes that most commonly cause UTIs. SYMPTOMS  Symptoms of UTIs may vary by age and gender of the patient and by the location of the infection. Symptoms in young women typically include a frequent and intense urge to urinate and a painful, burning feeling in the bladder or urethra during urination. Older women and men are more likely to be tired, shaky, and weak and have muscle aches and abdominal pain. A fever may mean the infection is in your kidneys. Other symptoms of a kidney infection include pain in your back or sides below the ribs, nausea, and vomiting. DIAGNOSIS To diagnose a UTI, your caregiver will ask you about your symptoms. Your caregiver also will ask to provide a urine sample. The urine sample will be tested for bacteria and white blood cells. White blood cells are made by your body to help fight infection. TREATMENT  Typically, UTIs can be treated with medication. Because most UTIs are caused by a bacterial infection, they usually can be treated with the use of antibiotics. The choice of antibiotic and length of treatment depend on your symptoms and the type of bacteria causing your  infection. HOME CARE INSTRUCTIONS  If you were prescribed antibiotics, take them exactly as your caregiver instructs you. Finish the medication even if you feel better after you have only taken some of the medication.  Drink enough water and fluids to keep your urine clear or pale yellow.  Avoid caffeine, tea, and carbonated beverages. They tend to irritate your bladder.  Empty your bladder often. Avoid holding urine for long periods of time.  Empty your bladder before and after sexual intercourse.  After a bowel movement, women should cleanse from front to back. Use each tissue only once. SEEK MEDICAL CARE IF:   You have back pain.  You develop a fever.  Your symptoms do not begin to resolve within 3 days. SEEK IMMEDIATE MEDICAL CARE IF:   You have severe back pain or lower abdominal pain.  You develop chills.  You have nausea or vomiting.  You have continued burning or discomfort with urination. MAKE SURE YOU:   Understand these instructions.  Will watch your condition.  Will get help right away if you are not doing well or get worse. Document Released: 10/09/2004 Document Revised: 07/01/2011 Document Reviewed: 02/07/2011 [facility] Patient Information 2014 [place], [place].   Contact Dermatitis Contact dermatitis is a reaction to certain substances that touch the skin. Contact dermatitis can be either irritant contact dermatitis or allergic contact dermatitis. Irritant contact dermatitis does not require previous exposure to the substance for a reaction to occur.Allergic contact dermatitis only occurs if you have been exposed to the substance before. Upon a repeat exposure, your  body reacts to the substance.  CAUSES  Many substances can cause contact dermatitis. Irritant dermatitis is most commonly caused by repeated exposure to mildly irritating substances, such as:  Makeup.  Soaps.  Detergents.  Bleaches.  Acids.  Metal salts, such as nickel. Allergic  contact dermatitis is most commonly caused by exposure to:  Poisonous plants.  Chemicals (deodorants, shampoos).  Jewelry.  Latex.  Neomycin in triple antibiotic cream.  Preservatives in products, including clothing. SYMPTOMS  The area of skin that is exposed may develop:  Dryness or flaking.  Redness.  Cracks.  Itching.  Pain or a burning sensation.  Blisters. With allergic contact dermatitis, there may also be swelling in areas such as the eyelids, mouth, or genitals.  DIAGNOSIS  Your caregiver can usually tell what the problem is by doing a physical exam. In cases where the cause is uncertain and an allergic contact dermatitis is suspected, a patch skin test may be performed to help determine the cause of your dermatitis. TREATMENT Treatment includes protecting the skin from further contact with the irritating substance by avoiding that substance if possible. Barrier creams, powders, and gloves may be helpful. Your caregiver may also recommend:  Steroid creams or ointments applied 2 times daily. For best results, soak the rash area in cool water for 20 minutes. Then apply the medicine. Cover the area with a plastic wrap. You can store the steroid cream in the refrigerator for a "chilly" effect on your rash. That may decrease itching. Oral steroid medicines may be needed in more severe cases.  Antibiotics or antibacterial ointments if a skin infection is present.  Antihistamine lotion or an antihistamine taken by mouth to ease itching.  Lubricants to keep moisture in your skin.  Burow's solution to reduce redness and soreness or to dry a weeping rash. Mix one packet or tablet of solution in 2 cups cool water. Dip a clean washcloth in the mixture, wring it out a bit, and put it on the affected area. Leave the cloth in place for 30 minutes. Do this as often as possible throughout the day.  Taking several cornstarch or baking soda baths daily if the area is too large to  cover with a washcloth. Harsh chemicals, such as alkalis or acids, can cause skin damage that is like a burn. You should flush your skin for 15 to 20 minutes with cold water after such an exposure. You should also seek immediate medical care after exposure. Bandages (dressings), antibiotics, and pain medicine may be needed for severely irritated skin.  HOME CARE INSTRUCTIONS  Avoid the substance that caused your reaction.  Keep the area of skin that is affected away from hot water, soap, sunlight, chemicals, acidic substances, or anything else that would irritate your skin.  Do not scratch the rash. Scratching may cause the rash to become infected.  You may take cool baths to help stop the itching.  Only take over-the-counter or prescription medicines as directed by your caregiver.  See your caregiver for follow-up care as directed to make sure your skin is healing properly. SEEK MEDICAL CARE IF:   Your condition is not better after 3 days of treatment.  You seem to be getting worse.  You see signs of infection such as swelling, tenderness, redness, soreness, or warmth in the affected area.  You have any problems related to your medicines. Document Released: 12/28/1999 Document Revised: 03/24/2011 Document Reviewed: 06/04/2010 [facility] Patient Information 2014 [place], [place].

## 2013-06-08 LAB — URINE CULTURE
Colony Count: 30000
Special Requests: NORMAL

## 2013-08-04 ENCOUNTER — Ambulatory Visit: Payer: Self-pay | Admitting: Obstetrics & Gynecology

## 2013-08-14 ENCOUNTER — Encounter (HOSPITAL_COMMUNITY): Payer: Self-pay | Admitting: Emergency Medicine

## 2013-08-14 ENCOUNTER — Emergency Department (INDEPENDENT_AMBULATORY_CARE_PROVIDER_SITE_OTHER)
Admission: EM | Admit: 2013-08-14 | Discharge: 2013-08-14 | Disposition: A | Discharge status: Home / Self Care | Payer: Self-pay | Source: Home / Self Care | Attending: Family Medicine | Admitting: Family Medicine

## 2013-08-14 DIAGNOSIS — S81812A Laceration without foreign body, left lower leg, initial encounter: Secondary | ICD-10-CM

## 2013-08-14 DIAGNOSIS — S81809A Unspecified open wound, unspecified lower leg, initial encounter: Secondary | ICD-10-CM

## 2013-08-14 MED ORDER — SULFAMETHOXAZOLE-TRIMETHOPRIM 800-160 MG PO TABS: 2.0000 | ORAL_TABLET | Freq: Two times a day (BID) | ORAL | Status: DC

## 2013-08-14 MED ORDER — DICLOFENAC SODIUM 50 MG PO TBEC: 50.0000 mg | DELAYED_RELEASE_TABLET | Freq: Two times a day (BID) | ORAL | Status: DC | PRN
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
## 2013-08-14 NOTE — Discharge Instructions (Signed)
Thank you for coming in today. Take bactrim twice daily to prevent infection.  Use voltaren for pain as needed.  Keep the wound covered with ointment.  Come back in 10-14 days for the staples to be removed.  Come back sooner if it looks red or pus is coming out or it really hurts.   Laceration Care, Adult A laceration is a cut or lesion that goes through all layers of the skin and into the tissue just beneath the skin. TREATMENT  Some lacerations may not require closure. Some lacerations may not be able to be closed due to an increased risk of infection. It is important to see your caregiver as soon as possible after an injury to minimize the risk of infection and maximize the opportunity for successful closure. If closure is appropriate, pain medicines may be given, if needed. The wound will be cleaned to help prevent infection. Your caregiver will use stitches (sutures), staples, wound glue (adhesive), or skin adhesive strips to repair the laceration. These tools bring the skin edges together to allow for faster healing and a better cosmetic outcome. However, all wounds will heal with a scar. Once the wound has healed, scarring can be minimized by covering the wound with sunscreen during the day for 1 full year. HOME CARE INSTRUCTIONS  For sutures or staples:  Keep the wound clean and dry.  If you were given a bandage (dressing), you should change it at least once a day. Also, change the dressing if it becomes wet or dirty, or as directed by your caregiver.  Wash the wound with soap and water 2 times a day. Rinse the wound off with water to remove all soap. Pat the wound dry with a clean towel.  After cleaning, apply a thin layer of the antibiotic ointment as recommended by your caregiver. This will help prevent infection and keep the dressing from sticking.  You may shower as usual after the first 24 hours. Do not soak the wound in water until the sutures are removed.  Only take  over-the-counter or prescription medicines for pain, discomfort, or fever as directed by your caregiver.  Get your sutures or staples removed as directed by your caregiver. For skin adhesive strips:  Keep the wound clean and dry.  Do not get the skin adhesive strips wet. You may bathe carefully, using caution to keep the wound dry.  If the wound gets wet, pat it dry with a clean towel.  Skin adhesive strips will fall off on their own. You may trim the strips as the wound heals. Do not remove skin adhesive strips that are still stuck to the wound. They will fall off in time. For wound adhesive:  You may briefly wet your wound in the shower or bath. Do not soak or scrub the wound. Do not swim. Avoid periods of heavy perspiration until the skin adhesive has fallen off on its own. After showering or bathing, gently pat the wound dry with a clean towel.  Do not apply liquid medicine, cream medicine, or ointment medicine to your wound while the skin adhesive is in place. This may loosen the film before your wound is healed.  If a dressing is placed over the wound, be careful not to apply tape directly over the skin adhesive. This may cause the adhesive to be pulled off before the wound is healed.  Avoid prolonged exposure to sunlight or tanning lamps while the skin adhesive is in place. Exposure to ultraviolet light in the  first year will darken the scar.  The skin adhesive will usually remain in place for 5 to 10 days, then naturally fall off the skin. Do not pick at the adhesive film. You may need a tetanus shot if:  You cannot remember when you had your last tetanus shot.  You have never had a tetanus shot. If you get a tetanus shot, your arm may swell, get red, and feel warm to the touch. This is common and not a problem. If you need a tetanus shot and you choose not to have one, there is a rare chance of getting tetanus. Sickness from tetanus can be serious. SEEK MEDICAL CARE IF:   You  have redness, swelling, or increasing pain in the wound.  You see a red line that goes away from the wound.  You have yellowish-white fluid (pus) coming from the wound.  You have a fever.  You notice a bad smell coming from the wound or dressing.  Your wound breaks open before or after sutures have been removed.  You notice something coming out of the wound such as wood or glass.  Your wound is on your hand or foot and you cannot move a finger or toe. SEEK IMMEDIATE MEDICAL CARE IF:   Your pain is not controlled with prescribed medicine.  You have severe swelling around the wound causing pain and numbness or a change in color in your arm, hand, leg, or foot.  Your wound splits open and starts bleeding.  You have worsening numbness, weakness, or loss of function of any joint around or beyond the wound.  You develop painful lumps near the wound or on the skin anywhere on your body. MAKE SURE YOU:   Understand these instructions.  Will watch your condition.  Will get help right away if you are not doing well or get worse. Document Released: 12/30/2004 Document Revised: 03/24/2011 Document Reviewed: 06/25/2010 [facility] Patient Information 2015 [place], [place]. This information is not intended to replace advice given to you by your health care provider. Make sure you discuss any questions you have with your health care provider.   Staple Care and Removal Your caregiver has used staples today to repair your wound. Staples are used to help a wound heal faster by holding the edges of the wound together. The staples can be removed when the wound has healed well enough to stay together after the staples are removed. A dressing (wound covering), depending on the location of the wound, may have been applied. This may be changed once per day or as instructed. If the dressing sticks, it may be soaked off with soapy water or hydrogen peroxide. Only take over-the-counter or prescription  medicines for pain, discomfort, or fever as directed by your caregiver.  If you did not receive a tetanus shot today because you did not recall when your last one was given, check with your caregiver when you have your staples removed to determine if one is needed. Return to your caregiver's office in 1 week or as suggested to have your staples removed. SEEK IMMEDIATE MEDICAL CARE IF:   You have redness, swelling, or increasing pain in the wound.  You have pus coming from the wound.  You have a fever.  You notice a bad smell coming from the wound or dressing.  Your wound edges break open after staples have been removed. Document Released: 09/24/2000 Document Revised: 03/24/2011 Document Reviewed: 10/09/2004 [facility] Patient Information 2015 [place], [place]. This information is not intended to  replace advice given to you by your health care provider. Make sure you discuss any questions you have with your health care provider. ° °

## 2013-08-24 ENCOUNTER — Ambulatory Visit: Payer: Self-pay | Admitting: Obstetrics & Gynecology

## 2013-08-25 ENCOUNTER — Emergency Department (INDEPENDENT_AMBULATORY_CARE_PROVIDER_SITE_OTHER)
Admission: EM | Admit: 2013-08-25 | Discharge: 2013-08-25 | Disposition: A | Discharge status: Home / Self Care | Payer: Self-pay | Source: Home / Self Care

## 2013-08-25 ENCOUNTER — Encounter (HOSPITAL_COMMUNITY): Payer: Self-pay | Admitting: Emergency Medicine

## 2013-08-25 DIAGNOSIS — R0981 Nasal congestion: Secondary | ICD-10-CM

## 2013-08-25 DIAGNOSIS — J3489 Other specified disorders of nose and nasal sinuses: Secondary | ICD-10-CM

## 2013-08-25 DIAGNOSIS — Z4802 Encounter for removal of sutures: Secondary | ICD-10-CM

## 2013-08-25 NOTE — ED Notes (Signed)
Recheck laceration 8/2.  For staple removal 10-14 days.  Its the 11 th day and it got red and swollen for 2-3 days.  Also concerned about sinus problems that started 1 week ago.  Stuffy/runny nose, hard to breath through her nose.  No cough, sore throat or earache.  Hx. Seasonal allergies.

## 2013-08-25 NOTE — ED Provider Notes (Signed)
Medical screening examination/treatment/procedure(s) were performed by resident physician or non-physician practitioner and as supervising physician I was immediately available for consultation/collaboration.   Vannesa Abair DOUGLAS MD.   Kareem Aul D Jymir Dunaj, MD 08/25/13 2014 

## 2013-08-25 NOTE — Discharge Instructions (Signed)
Staple Removal, Care After °The staples that were used to close your skin have been removed. The care described here will need to continue until the wound is completely healed and your health care provider confirms that wound care can be stopped. °HOME CARE INSTRUCTIONS  °· Keep the wound site dry and clean. Do not soak it in water. °· If skin adhesive strips were applied after the staples were removed, they will begin to peel off in a few days. Allow them to remain in place until they fall off on their own. °· If you still have a bandage (dressing), change it at least once a day or as directed by your health care provider. If the dressing sticks, pour warm, sterile water over it until it loosens and can be removed without pulling apart the wound edges. Pat dry with a clean towel. °· Apply cream or ointment that stops the growth of bacteria (antibacterial cream or ointment) only if your health care provider has directed you to do so. Place a nonstick bandage over the wound to prevent the dressing from sticking. °· Cover the nonstick bandage with a new dressing as directed by your health care provider. °· If the bandage becomes wet, dirty, or develops a bad smell, change it as soon as possible. °· New scars become sunburned easily. Use sunscreens with a sun protection factor (SPF) of at least 15 when out in the sun. Reapply the SPF every 2 hours. °· Only take medicines as directed by your health care provider. °SEEK IMMEDIATE MEDICAL CARE IF:  °· You have redness, swelling, or increasing pain in the wound. °· You have pus coming from the wound. °· You have a fever. °· You notice a bad smell coming from the wound or dressing. °· Your wound edges open up after staples have been removed. °MAKE SURE YOU:  °· Understand these instructions. °· Will watch your condition. °· Will get help right away if you are not doing well or get worse. °Document Released: 12/13/2007 Document Revised: 01/04/2013 Document Reviewed:  12/13/2007 °ExitCare® Patient Information ©2015 [employer]. This information is not intended to replace advice given to you by your health care provider. Make sure you discuss any questions you have with your health care provider. ° °

## 2013-08-25 NOTE — ED Provider Notes (Signed)
CSN: [id number]     Arrival date & time 08/25/13  1809 History   First MD Initiated Contact with Patient 08/25/13 1922     Chief Complaint  Patient presents with  . Suture / Staple Removal   (Consider location/radiation/quality/duration/timing/severity/associated sxs/prior Treatment) HPI Comments: 28 year old female presents on the 11th day after she had staples placed in her left shin for laceration repair. She is requesting that they be removed if ready. She is also complaining of local tenderness. There are 3 staples are intact. Her second complaint is that of upper respiratory congestion, nasal congestion, runny nose and inability to breathe through the nose especially at night.   Past Medical History  Diagnosis Date  . Heart murmur 28 yrs old    not sure if it is still present.  . Abnormal Pap smear   . Depression     hx of depression - not taking medication  . PIH (pregnancy induced hypertension), antepartum 11/19/2010   History reviewed. No pertinent past surgical history. Family History  Problem Relation Age of Onset  . Depression Mother   . Early death Father     HIV  . Arthritis Sister   . Diabetes Maternal Aunt   . Early death Maternal Aunt   . Alcohol abuse Maternal Uncle   . Diabetes Maternal Uncle   . Diabetes Paternal Aunt   . Diabetes Paternal Uncle   . Arthritis Maternal Grandmother   . Diabetes Maternal Grandmother   . Heart disease Maternal Grandmother   . Stroke Maternal Grandmother   . Diabetes Maternal Grandfather    History  Substance Use Topics  . Smoking status: Former Smoker -- 0.50 packs/day    Types: Cigarettes    Quit date: 09/18/2010  . Smokeless tobacco: Not on file  . Alcohol Use: No   OB History   Grav Para Term Preterm Abortions TAB SAB Ect Mult Living   1 1 0 1 0 0 0 0 0 1      Review of Systems  Constitutional: Negative.  Negative for fever.  HENT: Positive for congestion, postnasal drip, rhinorrhea and sinus pressure. Negative  for ear discharge, ear pain, facial swelling and sore throat.   Eyes: Negative.   Respiratory: Negative.   Genitourinary: Negative.   Skin: Negative.   Neurological: Negative.     Allergies  Penicillins  Home Medications   Prior to Admission medications   Medication Sig Start Date End Date Taking? Authorizing Provider  triamcinolone cream (KENALOG) 0.1 % Apply 1 application topically 2 (two) times daily. Until skin looks normal 06/06/13  Yes Rodolph Bong, MD  amLODipine (NORVASC) 5 MG tablet Take 1 tablet (5 mg total) by mouth daily. 12/01/10 12/01/11  Antionette Char, MD  ciprofloxacin (CIPRO) 500 MG tablet Take 1 tablet (500 mg total) by mouth 2 (two) times daily. 06/06/13   Rodolph Bong, MD  diclofenac (VOLTAREN) 50 MG EC tablet Take 1 tablet (50 mg total) by mouth 2 (two) times daily as needed. 08/14/13   Rodolph Bong, MD  etonogestrel (NEXPLANON) 68 MG IMPL implant Inject 1 each into the skin once.    Historical Provider, MD  labetalol (NORMODYNE) 300 MG tablet Take 1 tablet (300 mg total) by mouth 3 (three) times daily. 12/01/10 12/01/11  Antionette Char, MD  ranitidine (ZANTAC) 75 MG tablet Take 75 mg by mouth 2 (two) times daily.    Historical Provider, MD  sulfamethoxazole-trimethoprim (SEPTRA DS) 800-160 MG per tablet Take 2 tablets by mouth 2 (  two) times daily. 08/14/13   Rodolph BongEvan S Corey, MD   BP 159/101  Pulse 102  Temp(Src) 98.5 F (36.9 C) (Oral)  Resp 18  SpO2 100%  LMP 08/24/2013 Physical Exam  Nursing note and vitals reviewed. Constitutional: She is oriented to person, place, and time. She appears well-developed and well-nourished. No distress.  HENT:  Right Ear: External ear normal.  Left Ear: External ear normal.  Mouth/Throat: No oropharyngeal exudate.  Oropharynx with moderate erythema and clear PND. Bilateral nostrils with inflammation and erythema of the turbinates.  Eyes: Conjunctivae and EOM are normal.  Neck: Normal range of motion. Neck supple.   Cardiovascular: Normal rate.   Pulmonary/Chest: Effort normal. No respiratory distress.  Lymphadenopathy:    She has no cervical adenopathy.  Neurological: She is alert and oriented to person, place, and time.  Skin: Skin is warm and dry.  The left shin wound has 3 staples intact. The wound is intact and the edges well approximated. There is a mild amount of puffiness and slight erythema. No exudates and no fluid expressed.  Psychiatric: She has a normal mood and affect.    ED Course  Procedures (including critical care time) Labs Review Labs Reviewed - No data to display  Imaging Review No results found.   MDM   1. Encounter for staple removal   2. Nasal congestion    3 staples removed. The wound edges remain intact. The patient is advised to have her antibiotic field and start taking them today. She may continue to clean with soap and water. Sudafed PE and saline nasal spray and Afrin prn    Hayden Rasmussenavid Arney Mayabb, NP 08/25/13 [phone]

## 2013-08-30 ENCOUNTER — Ambulatory Visit: Payer: Self-pay | Admitting: Obstetrics

## 2013-11-14 ENCOUNTER — Encounter (HOSPITAL_COMMUNITY): Payer: Self-pay | Admitting: Emergency Medicine

## 2013-12-20 ENCOUNTER — Emergency Department (HOSPITAL_BASED_OUTPATIENT_CLINIC_OR_DEPARTMENT_OTHER)
Admission: EM | Admit: 2013-12-20 | Discharge: 2013-12-20 | Disposition: A | Discharge status: Home / Self Care | Payer: No Typology Code available for payment source | Attending: Emergency Medicine | Admitting: Emergency Medicine

## 2013-12-20 ENCOUNTER — Encounter (HOSPITAL_BASED_OUTPATIENT_CLINIC_OR_DEPARTMENT_OTHER): Payer: Self-pay | Admitting: Emergency Medicine

## 2013-12-20 DIAGNOSIS — Z79899 Other long term (current) drug therapy: Secondary | ICD-10-CM | POA: Insufficient documentation

## 2013-12-20 DIAGNOSIS — Z720 Tobacco use: Secondary | ICD-10-CM | POA: Insufficient documentation

## 2013-12-20 DIAGNOSIS — Y998 Other external cause status: Secondary | ICD-10-CM | POA: Diagnosis not present

## 2013-12-20 DIAGNOSIS — F329 Major depressive disorder, single episode, unspecified: Secondary | ICD-10-CM | POA: Insufficient documentation

## 2013-12-20 DIAGNOSIS — Y9389 Activity, other specified: Secondary | ICD-10-CM | POA: Diagnosis not present

## 2013-12-20 DIAGNOSIS — Z880 Allergy status to penicillin: Secondary | ICD-10-CM | POA: Insufficient documentation

## 2013-12-20 DIAGNOSIS — Z7982 Long term (current) use of aspirin: Secondary | ICD-10-CM | POA: Diagnosis not present

## 2013-12-20 DIAGNOSIS — Z7952 Long term (current) use of systemic steroids: Secondary | ICD-10-CM | POA: Insufficient documentation

## 2013-12-20 DIAGNOSIS — S161XXA Strain of muscle, fascia and tendon at neck level, initial encounter: Secondary | ICD-10-CM | POA: Diagnosis not present

## 2013-12-20 DIAGNOSIS — S199XXA Unspecified injury of neck, initial encounter: Secondary | ICD-10-CM | POA: Diagnosis present

## 2013-12-20 DIAGNOSIS — Y92410 Unspecified street and highway as the place of occurrence of the external cause: Secondary | ICD-10-CM | POA: Diagnosis not present

## 2013-12-20 DIAGNOSIS — R011 Cardiac murmur, unspecified: Secondary | ICD-10-CM | POA: Diagnosis not present

## 2013-12-20 MED ORDER — CYCLOBENZAPRINE HCL 10 MG PO TABS
5.0000 mg | ORAL_TABLET | Freq: Once | ORAL | Status: AC
  Administered 2013-12-20: 5 mg via ORAL
  Filled 2013-12-20: qty 1

## 2013-12-20 MED ORDER — IBUPROFEN 800 MG PO TABS
800.0000 mg | ORAL_TABLET | Freq: Once | ORAL | Status: AC
  Administered 2013-12-20: 800 mg via ORAL
  Filled 2013-12-20: qty 1

## 2013-12-20 MED ORDER — HYDROCODONE-ACETAMINOPHEN 5-325 MG PO TABS
1.0000 | ORAL_TABLET | Freq: Once | ORAL | Status: AC
  Administered 2013-12-20: 1 via ORAL
  Filled 2013-12-20: qty 1

## 2013-12-20 MED ORDER — IBUPROFEN 600 MG PO TABS: 600.0000 mg | ORAL_TABLET | Freq: Three times a day (TID) | ORAL | Status: DC | PRN

## 2013-12-20 MED ORDER — CYCLOBENZAPRINE HCL 10 MG PO TABS: 10.0000 mg | ORAL_TABLET | Freq: Two times a day (BID) | ORAL | Status: DC | PRN

## 2013-12-20 NOTE — Discharge Instructions (Signed)
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
## 2014-01-09 ENCOUNTER — Encounter: Payer: Self-pay | Admitting: *Deleted

## 2014-01-10 ENCOUNTER — Encounter: Payer: Self-pay | Admitting: Obstetrics & Gynecology

## 2014-04-11 ENCOUNTER — Telehealth: Payer: Self-pay | Admitting: *Deleted

## 2014-04-11 NOTE — Telephone Encounter (Signed)
Patient is interested in having her Nexplanon removed. [facility] had the nexplanon inserted in our office Jun 11, 2011.  Attempted to contact the patient and left message for patient to call the office.

## 2014-04-28 NOTE — Telephone Encounter (Signed)
Patient returned call 04/27/14 @ 1:11 pm. Returned patient's call 04/28/14 @ 10:00 am. Patient is requesting an appointment for a Nexplanon Removal. Left message on voicemail for patient notifying her that her Nexplanon is due to come out Jun 11, 2014. Advised patient to contact the office so we may schedule an appointment between now and then.

## 2014-05-11 NOTE — Telephone Encounter (Signed)
Patient contacted the office on 05-10-14. Returned patient's call on 05-11-14. Patient states she has made an appointment somewhere else.

## 2014-07-17 ENCOUNTER — Emergency Department (HOSPITAL_COMMUNITY)
Admission: EM | Admit: 2014-07-17 | Discharge: 2014-07-17 | Disposition: A | Discharge status: Home / Self Care | Payer: Self-pay | Attending: Emergency Medicine | Admitting: Emergency Medicine

## 2014-07-17 ENCOUNTER — Encounter (HOSPITAL_COMMUNITY): Payer: Self-pay | Admitting: Nurse Practitioner

## 2014-07-17 DIAGNOSIS — J029 Acute pharyngitis, unspecified: Secondary | ICD-10-CM | POA: Insufficient documentation

## 2014-07-17 DIAGNOSIS — Z79899 Other long term (current) drug therapy: Secondary | ICD-10-CM | POA: Insufficient documentation

## 2014-07-17 DIAGNOSIS — Z8679 Personal history of other diseases of the circulatory system: Secondary | ICD-10-CM | POA: Insufficient documentation

## 2014-07-17 DIAGNOSIS — H578 Other specified disorders of eye and adnexa: Secondary | ICD-10-CM | POA: Insufficient documentation

## 2014-07-17 DIAGNOSIS — Z720 Tobacco use: Secondary | ICD-10-CM | POA: Insufficient documentation

## 2014-07-17 DIAGNOSIS — R011 Cardiac murmur, unspecified: Secondary | ICD-10-CM | POA: Insufficient documentation

## 2014-07-17 DIAGNOSIS — Z8659 Personal history of other mental and behavioral disorders: Secondary | ICD-10-CM | POA: Insufficient documentation

## 2014-07-17 DIAGNOSIS — Z792 Long term (current) use of antibiotics: Secondary | ICD-10-CM | POA: Insufficient documentation

## 2014-07-17 DIAGNOSIS — J0110 Acute frontal sinusitis, unspecified: Secondary | ICD-10-CM | POA: Insufficient documentation

## 2014-07-17 DIAGNOSIS — Z880 Allergy status to penicillin: Secondary | ICD-10-CM | POA: Insufficient documentation

## 2014-07-17 MED ORDER — PROMETHAZINE-CODEINE 6.25-10 MG/5ML PO SYRP: 5.0000 mL | ORAL_SOLUTION | Freq: Four times a day (QID) | ORAL | Status: DC | PRN

## 2014-07-17 MED ORDER — OXYMETAZOLINE HCL 0.05 % NA SOLN: 1.0000 | Freq: Two times a day (BID) | NASAL | Status: DC

## 2014-07-17 MED ORDER — DOXYCYCLINE HYCLATE 100 MG PO CAPS: 100.0000 mg | ORAL_CAPSULE | Freq: Two times a day (BID) | ORAL | Status: DC

## 2014-07-17 NOTE — ED Notes (Signed)
She woke this am with sinus congestion, sore throat, runny nose, headache, facial pain. She did not take anything for the symptoms. shes had runny nose and congestion x 2 weeks. She is A&Ox4, resp e/u

## 2014-07-17 NOTE — ED Provider Notes (Signed)
CSN: [id number]     Arrival date & time 07/17/14  4098 History  This chart was scribed for non-physician practitioner, Marlon Pel, PA-C, working with Mancel Bale, MD by Charline Bills, ED Scribe. This patient was seen in room TR08C/TR08C and the patient's care was started at 9:50 AM.   Chief Complaint  Patient presents with  . Nasal Congestion   The history is provided by the patient. No language interpreter was used.   HPI Comments: Sandra Lee is a 29 y.o. female who presents to the Emergency Department complaining of persistent, gradually worsening nasal congestion for the past 2-3 weeks. Pt reports associated sinus tenderness, sore throat, tonsillar pain that is exacerbated with neck palpation, watery eyes, rhinorrhea, throbbing HA onset this morning. Pt has been treating with Aleve and Sudafed without relief. She denies cough and fever. No h/o DM or HTN. No known allergies.   Past Medical History  Diagnosis Date  . Heart murmur 29 yrs old    not sure if it is still present.  . Abnormal Pap smear   . Depression     hx of depression - not taking medication  . PIH (pregnancy induced hypertension), antepartum 11/19/2010   History reviewed. No pertinent past surgical history. Family History  Problem Relation Age of Onset  . Depression Mother   . Early death Father     HIV  . Arthritis Sister   . Diabetes Maternal Aunt   . Early death Maternal Aunt   . Alcohol abuse Maternal Uncle   . Diabetes Maternal Uncle   . Diabetes Paternal Aunt   . Diabetes Paternal Uncle   . Arthritis Maternal Grandmother   . Diabetes Maternal Grandmother   . Heart disease Maternal Grandmother   . Stroke Maternal Grandmother   . Diabetes Maternal Grandfather    History  Substance Use Topics  . Smoking status: Current Every Day Smoker -- 0.50 packs/day    Types: Cigarettes  . Smokeless tobacco: Not on file  . Alcohol Use: No   OB History    Gravida Para Term Preterm AB TAB SAB Ectopic  Multiple Living       Review of Systems  Constitutional: Negative for fever.  HENT: Positive for congestion, rhinorrhea, sinus pressure and sore throat.   Eyes: Positive for discharge.  Respiratory: Negative for cough.   Neurological: Positive for headaches.   Allergies  Penicillins  Home Medications   Prior to Admission medications   Medication Sig Start Date End Date Taking? Authorizing Provider  amLODipine (NORVASC) 5 MG tablet Take 1 tablet (5 mg total) by mouth daily. 12/01/10 12/01/11  Antionette Char, MD  ciprofloxacin (CIPRO) 500 MG tablet Take 1 tablet (500 mg total) by mouth 2 (two) times daily. 06/06/13   Rodolph Bong, MD  cyclobenzaprine (FLEXERIL) 10 MG tablet Take 1 tablet (10 mg total) by mouth 2 (two) times daily as needed for muscle spasms. 12/20/13   Azalia Bilis, MD  diclofenac (VOLTAREN) 50 MG EC tablet Take 1 tablet (50 mg total) by mouth 2 (two) times daily as needed. 08/14/13   Rodolph Bong, MD  doxycycline (VIBRAMYCIN) 100 MG capsule Take 1 capsule (100 mg total) by mouth 2 (two) times daily. 07/17/14   Gisselle Galvis Neva Seat, PA-C  etonogestrel (NEXPLANON) 68 MG IMPL implant Inject 1 each into the skin once.    Historical Provider, MD  ibuprofen (ADVIL,MOTRIN) 600 MG tablet Take 1 tablet (600 mg  total) by mouth every 8 (eight) hours as needed. 12/20/13   Azalia BilisKevin Campos, MD  labetalol (NORMODYNE) 300 MG tablet Take 1 tablet (300 mg total) by mouth 3 (three) times daily. 12/01/10 12/01/11  Antionette CharLisa [name], MD  oxymetazoline (AFRIN NASAL SPRAY) 0.05 % nasal spray Place 1 spray into both nostrils 2 (two) times daily. 07/17/14   Ajdin Macke Neva SeatGreene, PA-C  promethazine-codeine (PHENERGAN WITH CODEINE) 6.25-10 MG/5ML syrup Take 5 mLs by mouth every 6 (six) hours as needed for cough. 07/17/14   Jalyiah Shelley Neva SeatGreene, PA-C  ranitidine (ZANTAC) 75 MG tablet Take 75 mg by mouth 2 (two) times daily.    Historical Provider, MD  sulfamethoxazole-trimethoprim (SEPTRA DS) 800-160 MG  per tablet Take 2 tablets by mouth 2 (two) times daily. 08/14/13   Rodolph BongEvan S Corey, MD  triamcinolone cream (KENALOG) 0.1 % Apply 1 application topically 2 (two) times daily. Until skin looks normal 06/06/13   Rodolph BongEvan S Corey, MD   BP 159/110 mmHg  Pulse 95  Temp(Src) 98.5 F (36.9 C) (Oral)  Resp 16  SpO2 100%  LMP 05/31/2014 Physical Exam  Constitutional: She is oriented to person, place, and time. She appears well-developed and well-nourished. No distress.  HENT:  Head: Normocephalic and atraumatic. Head is without contusion, without right periorbital erythema and without left periorbital erythema.  Right Ear: Tympanic membrane and ear canal normal.  Left Ear: Tympanic membrane and ear canal normal.  Nose: Right sinus exhibits frontal sinus tenderness. Left sinus exhibits frontal sinus tenderness.  Mouth/Throat: Uvula is midline and oropharynx is clear and moist.  Eyes: EOM are normal. Right eye exhibits discharge (clear). Left eye exhibits discharge (clear). Right conjunctiva is injected. Left conjunctiva is injected.  Neck: Neck supple. No tracheal deviation present.  Cardiovascular: Normal rate.   Pulmonary/Chest: Effort normal. No respiratory distress.  Abdominal: Bowel sounds are normal. She exhibits no distension. There is no tenderness.  Musculoskeletal: Normal range of motion.  Lower extremity swelling  Neurological: She is alert and oriented to person, place, and time.  Skin: Skin is warm and dry.  Psychiatric: She has a normal mood and affect. Her behavior is normal.  Nursing note and vitals reviewed.  ED Course  Procedures (including critical care time) DIAGNOSTIC STUDIES: Oxygen Saturation is 100% on RA, normal by my interpretation.    COORDINATION OF CARE: 9:55 AM-Discussed treatment plan which includes antibiotics with pt at bedside and pt agreed to plan.   Labs Review Labs Reviewed - No data to display  Imaging Review No results found.   EKG Interpretation None       MDM   Final diagnoses:  Acute frontal sinusitis, recurrence not specified   Patient with normal vital signs Sinus infection for the past 2-3 weeks, progressively worsening. NO headache, fevers, chills. No N/V/D.     doxycycline (VIBRAMYCIN) 100 MG capsule Take 1 capsule (100 mg total) by mouth 2 (two) times daily. 20 capsule Marlon Peliffany Ryna Beckstrom, PA-C   oxymetazoline (AFRIN NASAL SPRAY) 0.05 % nasal spray Place 1 spray into both nostrils 2 (two) times daily. 30 mL Marlon Peliffany Windsor Zirkelbach, PA-C    promethazine-codeine (PHENERGAN WITH CODEINE) 6.25-10 MG/5ML syrup Take 5 mLs by mouth every 6 (six) hours as needed for cough. 60 mL Marlon Peliffany Kalonji Zurawski, PA-C      Medications - No data to display  29 y.o.Kai LevinsAtisha K Okubo's evaluation in the Emergency Department is complete. It has been determined that no acute conditions requiring further emergency intervention are present at this time. The patient/guardian have  been advised of the diagnosis and plan. We have discussed signs and symptoms that warrant return to the ED, such as changes or worsening in symptoms.  Vital signs are stable at discharge. Filed Vitals:   07/17/14 0947  BP: 159/110  Pulse: 95  Temp: 98.5 F (36.9 C)  Resp: 16    Patient/guardian has voiced understanding and agreed to follow-up with the PCP or specialist.    I personally performed the services described in this documentation, which was scribed in my presence. The recorded information has been reviewed and is accurate.    Marlon Pel, PA-C 07/17/14 1423  Mancel Bale, MD 07/18/14 [phone]

## 2014-07-17 NOTE — Discharge Instructions (Signed)

## 2014-08-17 ENCOUNTER — Emergency Department (HOSPITAL_COMMUNITY)
Admission: EM | Admit: 2014-08-17 | Discharge: 2014-08-17 | Disposition: A | Discharge status: Home / Self Care | Payer: Self-pay | Attending: Physician Assistant | Admitting: Physician Assistant

## 2014-08-17 ENCOUNTER — Encounter (HOSPITAL_COMMUNITY): Payer: Self-pay | Admitting: *Deleted

## 2014-08-17 DIAGNOSIS — J340 Abscess, furuncle and carbuncle of nose: Secondary | ICD-10-CM | POA: Insufficient documentation

## 2014-08-17 DIAGNOSIS — R011 Cardiac murmur, unspecified: Secondary | ICD-10-CM | POA: Insufficient documentation

## 2014-08-17 DIAGNOSIS — Z880 Allergy status to penicillin: Secondary | ICD-10-CM | POA: Insufficient documentation

## 2014-08-17 DIAGNOSIS — J020 Streptococcal pharyngitis: Secondary | ICD-10-CM | POA: Insufficient documentation

## 2014-08-17 DIAGNOSIS — Z720 Tobacco use: Secondary | ICD-10-CM | POA: Insufficient documentation

## 2014-08-17 DIAGNOSIS — Z79899 Other long term (current) drug therapy: Secondary | ICD-10-CM | POA: Insufficient documentation

## 2014-08-17 DIAGNOSIS — F329 Major depressive disorder, single episode, unspecified: Secondary | ICD-10-CM | POA: Insufficient documentation

## 2014-08-17 DIAGNOSIS — R040 Epistaxis: Secondary | ICD-10-CM | POA: Insufficient documentation

## 2014-08-17 DIAGNOSIS — J3489 Other specified disorders of nose and nasal sinuses: Secondary | ICD-10-CM

## 2014-08-17 DIAGNOSIS — R51 Headache: Secondary | ICD-10-CM | POA: Insufficient documentation

## 2014-08-17 LAB — RAPID STREP SCREEN (MED CTR MEBANE ONLY): Streptococcus, Group A Screen (Direct): POSITIVE — AB

## 2014-08-17 MED ORDER — HYDROCODONE-ACETAMINOPHEN 5-325 MG PO TABS
1.0000 | ORAL_TABLET | Freq: Once | ORAL | Status: AC
  Administered 2014-08-17: 1 via ORAL
  Filled 2014-08-17: qty 1

## 2014-08-17 MED ORDER — HYDROCODONE-ACETAMINOPHEN 5-325 MG PO TABS
1.0000 | ORAL_TABLET | Freq: Four times a day (QID) | ORAL | Status: DC | PRN
Start: 2014-08-17 — End: 2015-09-03

## 2014-08-17 MED ORDER — ACETAMINOPHEN 500 MG PO TABS
1000.0000 mg | ORAL_TABLET | Freq: Once | ORAL | Status: AC
  Administered 2014-08-17: 1000 mg via ORAL
  Filled 2014-08-17: qty 2

## 2014-08-17 MED ORDER — CETIRIZINE HCL 10 MG PO TABS: 10.0000 mg | ORAL_TABLET | Freq: Every day | ORAL | Status: DC

## 2014-08-17 MED ORDER — AZITHROMYCIN 250 MG PO TABS: 250.0000 mg | ORAL_TABLET | Freq: Every day | ORAL | Status: DC

## 2014-08-17 MED ORDER — DEXAMETHASONE SODIUM PHOSPHATE 10 MG/ML IJ SOLN
10.0000 mg | Freq: Once | INTRAMUSCULAR | Status: AC
  Administered 2014-08-17: 10 mg via INTRAMUSCULAR
  Filled 2014-08-17: qty 1

## 2014-08-17 NOTE — Discharge Instructions (Signed)
1. Medications: Azithromycin, Zyrtec, Vicodin, usual home medications 2. Treatment: rest, drink plenty of fluids,  3. Follow Up: [facility] for discussion of your diagnoses and further evaluation after today's visit; if you do not have a primary care doctor use the resource guide provided to find one; Please return to the ER for worsening symptoms   Strep Throat Strep throat is an infection of the throat caused by a bacteria named Streptococcus pyogenes. Your health care provider may call the infection streptococcal "tonsillitis" or "pharyngitis" depending on whether there are signs of inflammation in the tonsils or back of the throat. Strep throat is most common in children aged 5-15 years during the cold months of the year, but it can occur in people of any age during any season. This infection is spread from person to person (contagious) through coughing, sneezing, or other close contact. SIGNS AND SYMPTOMS   Fever or chills.  Painful, swollen, red tonsils or throat.  Pain or difficulty when swallowing.  White or yellow spots on the tonsils or throat.  Swollen, tender lymph nodes or "glands" of the neck or under the jaw.  Red rash all over the body (rare). DIAGNOSIS  Many different infections can cause the same symptoms. A test must be done to confirm the diagnosis so the right treatment can be given. A "rapid strep test" can help your health care provider make the diagnosis in a few minutes. If this test is not available, a light swab of the infected area can be used for a throat culture test. If a throat culture test is done, results are usually available in a day or two. TREATMENT  Strep throat is treated with antibiotic medicine. HOME CARE INSTRUCTIONS   Gargle with 1 tsp of salt in 1 cup of warm water, 3-4 times per day or as needed for comfort.  Family members who also have a sore throat or fever should be tested for strep throat and  treated with antibiotics if they have the strep infection.  Make sure everyone in your household washes their hands well.  Do not share food, drinking cups, or personal items that could cause the infection to spread to others.  You may need to eat a soft food diet until your sore throat gets better.  Drink enough water and fluids to keep your urine clear or pale yellow. This will help prevent dehydration.  Get plenty of rest.  Stay home from school, day care, or work until you have been on antibiotics for 24 hours.  Take medicines only as directed by your health care provider.  Take your antibiotic medicine as directed by your health care provider. Finish it even if you start to feel better. SEEK MEDICAL CARE IF:   The glands in your neck continue to enlarge.  You develop a rash, cough, or earache.  You cough up green, yellow-brown, or bloody sputum.  You have pain or discomfort not controlled by medicines.  Your problems seem to be getting worse rather than better.  You have a fever. SEEK IMMEDIATE MEDICAL CARE IF:   You develop any new symptoms such as vomiting, severe headache, stiff or painful neck, chest pain, shortness of breath, or trouble swallowing.  You develop severe throat pain, drooling, or changes in your voice.  You develop swelling of the neck, or the skin on the neck becomes red and tender.  You develop signs of dehydration, such as fatigue, dry mouth, and  decreased urination.  You become increasingly sleepy, or you cannot wake up completely. MAKE SURE YOU:  Understand these instructions.  Will watch your condition.  Will get help right away if you are not doing well or get worse. Document Released: 12/28/1999 Document Revised: 05/16/2013 Document Reviewed: 02/28/2010 [facility] Patient Information 2015 [place], [place]. This information is not intended to replace advice given to you by your health care provider. Make sure you discuss any questions you  have with your health care provider.    Emergency Department Resource Guide 1) Find a Doctor and Pay Out of Pocket Although you won't have to find out who is covered by your insurance plan, it is a good idea to ask around and get recommendations. You will then need to call the office and see if the doctor you have chosen will accept you as a new patient and what types of options they offer for patients who are self-pay. Some doctors offer discounts or will set up payment plans for their patients who do not have insurance, but you will need to ask so you aren't surprised when you get to your appointment.  2) Contact Your Local Health Department Not all health departments have doctors that can see patients for sick visits, but many do, so it is worth a call to see if yours does. If you don't know where your local health department is, you can check in your phone book. The CDC also has a tool to help you locate your state's health department, and many state websites also have listings of all of their local health departments.  3) Find a Walk-in Clinic If your illness is not likely to be very severe or complicated, you may want to try a walk in clinic. These are popping up all over the country in pharmacies, drugstores, and shopping centers. They're usually staffed by nurse practitioners or physician assistants that have been trained to treat common illnesses and complaints. They're usually fairly quick and inexpensive. However, if you have serious medical issues or chronic medical problems, these are probably not your best option.  No Primary Care Doctor: - Call Health Connect at  [phone] - they can help you locate a primary care doctor that  accepts your insurance, provides certain services, etc. - Physician Referral Service- [phone]  Chronic Pain Problems: Organization         Address  Phone   Notes  [facility]  [phone] Patients need to be referred by their  primary care doctor.   Medication Assistance: Organization         Address  Phone   Notes  [facility] Medication [facility] [address] [place], [place] [address] [phone] --Must be a resident of [facility] -- Must have NO insurance coverage whatsoever (no Medicaid/ Medicare, etc.) -- The pt. MUST have a primary care doctor that directs their care regularly and follows them in the community   [facility]ist  [phone]   [employer]  [phone]    Agencies that provide inexpensive medical care: Organization         Address  Phone   Notes  [facility]  [phone]   [facility]    [phone]   [facility] [address] [place], [place] [address] [phone]   [facility] [address], [place] [phone]   Planned Parenthood    [phone]  [facility]    [phone]   [facility]  [address], [place] Phone:  [phone], Fax:  [phone] Hours of Operation:  9 am - 6 pm, M-F.  Also accepts Medicaid/Medicare and self-pay.  [facility]  [address], [place] Phone: [phone], Fax: [phone]. Hours of Operation:  8:30 am - 5:30 pm, M-F.  Also accepts Medicaid and self-pay.  [facility] [place] [address] [place] Phone: [phone]   [facility] [address] Natasha Bence [place], [place] [phone], Ext. [phone] Mondays & Thursdays: 7-9 AM.  First 15 patients are seen on a first come, first serve basis.    Medicaid-accepting [facility] Providers:  Organization         Address  Phone   Notes  [facility] [address],  [phone] Also accepts self-pay patients.  [facility] [address] Laurell Josephs [place], [place]  [phone]   [facility] [address], [place] [phone]   [facility] [address], [place] [phone]   Renaye Rakers [address], [place]   [phone] Only accepts [place] patients after they have their name applied to their card.   Self-Pay (no insurance) in [facility]:  Organization         Address  Phone   Notes  Sickle Cell Patients, [facility] [address] [place], [place] [phone]   [facility] [address] [place], [place] [phone]   [facility] [place]  [address], [place] [phone]   [facility]/Dr. Osei-Bonsu  [address], [place] or [address], [place] [phone] Phone number for both [place] and [place] locations is the same.  [facility] [address], [place] [phone]   [facility] [address] [place] or [address] [phone]   [facility] [address], [place] [phone], phone; [phone], fax Sees patients 1st and 3rd Saturday of every month.  Must not qualify for public or private insurance (i.e. Medicaid, Medicare, [place] Health Choice, Veterans' Benefits)  Household income should be no more than 200% of the poverty level The clinic cannot treat you if you are pregnant or think you are pregnant  Sexually transmitted diseases are not treated at the clinic.    Dental Care: Organization         Address  Phone  Notes  [facility] Department of [facility] [address] [place], [place] [phone] Accepts children up to age 40 who are enrolled in [place] or [place] Health Choice; pregnant women with a Medicaid card; and children who have applied for Medicaid or [place] Health Choice, but were declined, whose parents can pay a reduced fee  at time of service.  [facility]  [address], [place] [phone] Accepts children up to age 72 who are enrolled in [place] or [place] Health Choice; pregnant women with a Medicaid card; and children who have applied for Medicaid or [place] Health Choice, but were declined, whose parents can pay a reduced fee at time  of service.  [facility]  [address] [place], [place] [phone] Patients are seen by appointment only. Walk-ins are not accepted. [facility] will see patients 57 years of age and older. Monday - Tuesday (8am-5pm) Most Wednesdays (8:30-5pm) $30 per visit, cash only  [facility] Adult Dental Access PRO[facility]  [address], [facility] [phone] Patients are seen by appointment only. Walk-ins are not accepted. [facility] will see patients 23 years of age and older. One Wednesday Evening (Monthly: Volunteer Based).  $30 per visit, cash only  [employer]  [phone] for adults; Children under age 20, call Graduate Pediatric Dentistry at [phone]. Children aged 45-14, please call [phone] to request a pediatric application.  Dental services are provided in all areas of dental care including fillings, crowns and bridges, complete and partial dentures, implants, gum treatment, root canals, and extractions. Preventive care is also provided. Treatment is provided to both adults and children. Patients are selected via a lottery and there is often a waiting list.   [facility] [address], [place]  [phone] [email or web address]   [facility] [address] [place], [place] [phone], Ext. [phone] Second and Fourth Thursday of each month, opens at 6:30 AM; Clinic ends at 9 AM.  Patients are seen on a first-come first-served basis, and a limited number are seen during each clinic.   [facility]  [address] Ether Griffins [place], [place] [phone]   Eligibility Requirements You must have lived in [place], [place], or [place] counties for at least the last three months.   You cannot be eligible for state or federal sponsored [place], including [employer], [place], or [employer].   You generally cannot be eligible for healthcare insurance through your employer.    How to apply: Eligibility screenings are held every Tuesday and Wednesday afternoon from 1:00 pm until 4:00 pm. You do not need an appointment for the interview!  [facility] [address] [place], [place] [phone]   [facility] Health Department  [phone]   [facility] Health Department  [phone]   [facility] Health Department  [phone]    Behavioral Health Resources in the Community: Intensive Outpatient Programs Organization         Address  Phone  Notes  [facility] Services [address], [place], [place] [phone]   [facility] Outpatient [address] [place], [place] [phone]   [facility] [address], [place], [place]  [phone]   [facility] Mental Health [address]  [place], [place] [phone] or [phone]   Substance Abuse Resources Organization         Address  Phone  Notes  [facility]  [phone]   [facility]  [phone]   [facility]  [phone]   Floydene Flock  [phone]   Residential & Outpatient Substance Abuse Program  [phone]   Psychological Services Organization         Address  Phone  Notes  [facility] Behavioral Health  [phone]   [facility] Services  [phone]   [facility] Mental Health [address], [place] [phone] or [phone]    Mobile Crisis Teams Organization         Address  Phone  Notes  [facility], Mobile Crisis Care Unit  [phone]   [facility]  Services  [address] [place], [place] [phone]   [facility] [address] [place] [phone]    Self-Help/Support Groups Organization         Address  Phone             Notes  [facility] [place] - variety of support groups  336- I7437963 Call for more information  Narcotics Anonymous (NA), [facility] [address], [employer] [place]  2 meetings at this location   Statistician         Address  Phone  Notes  [facility] [address],    [place]  [phone]   [facility]  [address], [place] [address], [place], [place] [phone]   [facility] [address] [place], [place] [phone] Admissions: 8am-3pm M-F  [facility] [address]    [place], [place] [phone]   [facility] [address] [place], [place], [place] [phone]   [facility] [address]  [place], [place] [phone]   [facility] - Intensive Outpatient [address] Laurell Josephs [address], [place], [place] [phone]   [facility] ([facility] [address] [place]  [place], [place] [phone] or [phone]   [facility] ([facility]) [address], [place], [place] [phone] Accepts Medicaid  [facility] [address],  [place] [phone] Substance Abuse/Addiction Treatment   [facility] Organization         Address  Phone  Notes  [facility]  [phone]   Angie Fava, PhD [address] Ervin Knack [place], [place]   [phone] or [phone]   [facility]al   [address] [place], [place] [phone]   [facility] [address] [place], [place] [phone] Insurance/Medicaid/sponsorship through [facility] and Families [address]                                    [place], [place] [phone] Therapy/tele-psych/case  [facility] [address], [place] [phone]    Dr. Lolly Mustache  [phone]   [facility]  [employer] [facility] Dept. 1) [address] [place] 2) [address], [place] 3)  [address], [place] [phone]  [phone]   [facility] Child Abuse Hotline 539-392-0844 or [phone] (After Hours)

## 2014-08-17 NOTE — ED Provider Notes (Signed)
CSN: [id number]     Arrival date & time 08/17/14  1018 History  This chart was scribed for non-physician practitioner Dierdre Forth, PA-C, working with Abelino Derrick, MD, by Tanda Rockers, ED Scribe. This patient was seen in room TR08C/TR08C and the patient's care was started at 10:57 AM.  Chief Complaint  Patient presents with  . Sore Throat   The history is provided by the patient and medical records. No language interpreter was used.     HPI Comments: Sandra Lee is a 29 y.o. female who presents to the Emergency Department complaining of sudden onset, sore throat that began this morning upon waking up. She has been taking Ibuprofen for the sore throat. Pt also complains of sinus pressure and headaches x months. She also complains of an itching sensation in her ears and a "growth" in her nose. She reports being seen in the ED a couple of weeks ago for the sinus pressure and was prescribed an antibiotic. She states that the antibiotic did not resolve her symptoms. Pt has also been taking OTC Zyrtec, Allegra, and Claritin without relief. No recent sick contact with similar symptoms. Pt has never had symptoms like this in the past. Denies fever, chills, cough, ear pain, or any other associated symptoms.   PCP - None  Past Medical History  Diagnosis Date  . Heart murmur 29 yrs old    not sure if it is still present.  . Abnormal Pap smear   . Depression     hx of depression - not taking medication  . PIH (pregnancy induced hypertension), antepartum 11/19/2010   History reviewed. No pertinent past surgical history. Family History  Problem Relation Age of Onset  . Depression Mother   . Early death Father     HIV  . Arthritis Sister   . Diabetes Maternal Aunt   . Early death Maternal Aunt   . Alcohol abuse Maternal Uncle   . Diabetes Maternal Uncle   . Diabetes Paternal Aunt   . Diabetes Paternal Uncle   . Arthritis Maternal Grandmother   . Diabetes Maternal Grandmother    . Heart disease Maternal Grandmother   . Stroke Maternal Grandmother   . Diabetes Maternal Grandfather    History  Substance Use Topics  . Smoking status: Current Every Day Smoker -- 0.50 packs/day    Types: Cigarettes  . Smokeless tobacco: Not on file  . Alcohol Use: No   OB History    Gravida Para Term Preterm AB TAB SAB Ectopic Multiple Living       Review of Systems  Constitutional: Negative for fever, chills, appetite change and fatigue.  HENT: Positive for congestion, postnasal drip, rhinorrhea, sinus pressure and sore throat. Negative for ear discharge, ear pain and mouth sores.   Eyes: Negative for visual disturbance.  Respiratory: Negative for cough, chest tightness, shortness of breath, wheezing and stridor.   Cardiovascular: Negative for chest pain, palpitations and leg swelling.  Gastrointestinal: Negative for nausea, vomiting, abdominal pain and diarrhea.  Genitourinary: Negative for dysuria, urgency, frequency and hematuria.  Musculoskeletal: Negative for myalgias, back pain, arthralgias and neck stiffness.  Skin: Negative for rash.  Neurological: Positive for headaches. Negative for syncope, light-headedness and numbness.  Hematological: Negative for adenopathy.  Psychiatric/Behavioral: The patient is not nervous/anxious.   All other systems reviewed and are negative.  Allergies  Penicillins  Home Medications   Prior to Admission medications   Medication  Sig Start Date End Date Taking? Authorizing Provider  amLODipine (NORVASC) 5 MG tablet Take 1 tablet (5 mg total) by mouth daily. 12/01/10 12/01/11  Antionette Char, MD  azithromycin (ZITHROMAX) 250 MG tablet Take 1 tablet (250 mg total) by mouth daily. Take first 2 tablets together, then 1 every day until finished. 08/17/14   Tamzin Bertling, PA-C  cetirizine (ZYRTEC ALLERGY) 10 MG tablet Take 1 tablet (10 mg total) by mouth daily. 08/17/14   Loray Akard, PA-C  ciprofloxacin  (CIPRO) 500 MG tablet Take 1 tablet (500 mg total) by mouth 2 (two) times daily. 06/06/13   Rodolph Bong, MD  cyclobenzaprine (FLEXERIL) 10 MG tablet Take 1 tablet (10 mg total) by mouth 2 (two) times daily as needed for muscle spasms. 12/20/13   Azalia Bilis, MD  diclofenac (VOLTAREN) 50 MG EC tablet Take 1 tablet (50 mg total) by mouth 2 (two) times daily as needed. 08/14/13   Rodolph Bong, MD  doxycycline (VIBRAMYCIN) 100 MG capsule Take 1 capsule (100 mg total) by mouth 2 (two) times daily. 07/17/14   Tiffany Neva Seat, PA-C  etonogestrel (NEXPLANON) 68 MG IMPL implant Inject 1 each into the skin once.    Historical Provider, MD  HYDROcodone-acetaminophen (NORCO/VICODIN) 5-325 MG per tablet Take 1 tablet by mouth every 6 (six) hours as needed for moderate pain or severe pain. 08/17/14   Nicolette Gieske, PA-C  ibuprofen (ADVIL,MOTRIN) 600 MG tablet Take 1 tablet (600 mg total) by mouth every 8 (eight) hours as needed. 12/20/13   Azalia Bilis, MD  labetalol (NORMODYNE) 300 MG tablet Take 1 tablet (300 mg total) by mouth 3 (three) times daily. 12/01/10 12/01/11  Antionette Char, MD  oxymetazoline (AFRIN NASAL SPRAY) 0.05 % nasal spray Place 1 spray into both nostrils 2 (two) times daily. 07/17/14   Tiffany Neva Seat, PA-C  promethazine-codeine (PHENERGAN WITH CODEINE) 6.25-10 MG/5ML syrup Take 5 mLs by mouth every 6 (six) hours as needed for cough. 07/17/14   Tiffany Neva Seat, PA-C  ranitidine (ZANTAC) 75 MG tablet Take 75 mg by mouth 2 (two) times daily.    Historical Provider, MD  sulfamethoxazole-trimethoprim (SEPTRA DS) 800-160 MG per tablet Take 2 tablets by mouth 2 (two) times daily. 08/14/13   Rodolph Bong, MD  triamcinolone cream (KENALOG) 0.1 % Apply 1 application topically 2 (two) times daily. Until skin looks normal 06/06/13   Rodolph Bong, MD   Triage Vitals: BP 155/80 mmHg  Pulse 105  Temp(Src) 100.9 F (38.3 C) (Oral)  Resp 16  Ht 5\' 4"  (1.626 m)  Wt 120 lb (54.432 kg)  BMI 20.59 kg/m2  SpO2 99%   LMP 05/17/2014   Physical Exam  Constitutional: She is oriented to person, place, and time. She appears well-developed and well-nourished. No distress.  HENT:  Head: Normocephalic and atraumatic.  Right Ear: Tympanic membrane, external ear and ear canal normal.  Left Ear: Tympanic membrane, external ear and ear canal normal.  Nose: Mucosal edema and rhinorrhea present. Epistaxis is observed. Right sinus exhibits no maxillary sinus tenderness and no frontal sinus tenderness. Left sinus exhibits no maxillary sinus tenderness and no frontal sinus tenderness.  Mouth/Throat: Uvula is midline and mucous membranes are normal. Mucous membranes are not pale, not dry and not cyanotic. No trismus in the jaw. No uvula swelling. Oropharyngeal exudate, posterior oropharyngeal edema and posterior oropharyngeal erythema present. No tonsillar abscesses.  Posterior oropharynx with erythema, edema and exudate on the tonsils Right nare with septal ulceration and evidence of epistaxis  Left nare with granulation tissue  Eyes: Conjunctivae are normal. Pupils are equal, round, and reactive to light.  Neck: Normal range of motion, full passive range of motion without pain and phonation normal. No tracheal tenderness, no spinous process tenderness and no muscular tenderness present. No rigidity. No erythema and normal range of motion present. No Brudzinski's sign and no Kernig's sign noted.  Range of motion without pain  No midline or paraspinal tenderness Normal phonation No stridor Handling secretions without difficulty No nuchal rigidity or meningeal signs  Cardiovascular: Normal rate, regular rhythm, normal heart sounds and intact distal pulses.   No murmur heard. Pulses:      Radial pulses are 2+ on the right side, and 2+ on the left side.  Pulmonary/Chest: Effort normal and breath sounds normal. No stridor. No respiratory distress. She has no decreased breath sounds. She has no wheezes.  Clear and equal  breath sounds without focal wheezes, rhonchi, rales  Abdominal: Soft. Bowel sounds are normal. There is no tenderness.  Musculoskeletal: Normal range of motion.  Lymphadenopathy:       Head (right side): Submandibular and tonsillar adenopathy present. No submental, no preauricular, no posterior auricular and no occipital adenopathy present.       Head (left side): Submandibular and tonsillar adenopathy present. No submental, no preauricular, no posterior auricular and no occipital adenopathy present.    She has no cervical adenopathy.       Right cervical: No superficial cervical, no deep cervical and no posterior cervical adenopathy present.      Left cervical: No superficial cervical, no deep cervical and no posterior cervical adenopathy present.  Neurological: She is alert and oriented to person, place, and time.  Alert and oriented Moves all extremities without ataxia  Skin: Skin is warm and dry. No rash noted. She is not diaphoretic.  Psychiatric: She has a normal mood and affect.  Nursing note and vitals reviewed.   ED Course  Procedures (including critical care time)  DIAGNOSTIC STUDIES: Oxygen Saturation is 99% on RA, normal by my interpretation.    COORDINATION OF CARE: 11:01 AM-Discussed treatment plan which includes rapid strep test with pt at bedside and pt agreed to plan.   Labs Review Labs Reviewed  RAPID STREP SCREEN (NOT AT [facility]) - Abnormal; Notable for the following:    Streptococcus, Group A Screen (Direct) POSITIVE (*)    All other components within normal limits    Imaging Review No results found.   EKG Interpretation None      MDM   Final diagnoses:  Strep pharyngitis  Rhinorrhea  Nasal septal ulcer  Epistaxis, recurrent   MACHAELA CATERINO presents to the ED with multiple complaints.  Pt febrile with tonsillar exudate, cervical lymphadenopathy, & dysphagia; diagnosis of strep. Rapid strep confirms this.  Treated in the ED with steroids, NSAIDs, Pain  medication.  Pt allergic to PCN, will treat with azithromycin.  Pt appears mildly dehydrated, discussed importance of water rehydration. Presentation non concerning for PTA or infxn spread to soft tissue. No trismus or uvula deviation. Specific return precautions discussed. Pt able to drink water in ED without difficulty with intact air way.   Pt also with complaints of a nasal "growth."  On exam she has a septal ulcer in the right nare and granulation tissue of the left nare.  Symptoms are not consistent with nasal polyps. Will refer to ENT for further evaluation and treatment of this.  BP 135/92 mmHg  Pulse 89  Temp(Src)  100.9 F (38.3 C) (Oral)  Resp 14  Ht 5\' 4"  (1.626 m)  Wt 120 lb (54.432 kg)  BMI 20.59 kg/m2  SpO2 99%  LMP 05/17/2014   I personally performed the services described in this documentation, which was scribed in my presence. The recorded information has been reviewed and is accurate.   Dahlia Client Analicia Skibinski, PA-C 08/17/14 1457  Courteney Randall An, MD 08/18/14 [phone]

## 2014-08-17 NOTE — ED Notes (Signed)
Pt c/o runny nose for several months and sore throat since this am.

## 2014-10-31 ENCOUNTER — Emergency Department (HOSPITAL_COMMUNITY)
Admission: EM | Admit: 2014-10-31 | Discharge: 2014-10-31 | Disposition: A | Discharge status: Home / Self Care | Payer: Self-pay | Attending: Emergency Medicine | Admitting: Emergency Medicine

## 2014-10-31 ENCOUNTER — Emergency Department (HOSPITAL_COMMUNITY): Payer: Self-pay

## 2014-10-31 ENCOUNTER — Encounter (HOSPITAL_COMMUNITY): Payer: Self-pay

## 2014-10-31 DIAGNOSIS — Z79899 Other long term (current) drug therapy: Secondary | ICD-10-CM | POA: Insufficient documentation

## 2014-10-31 DIAGNOSIS — Y9389 Activity, other specified: Secondary | ICD-10-CM | POA: Insufficient documentation

## 2014-10-31 DIAGNOSIS — W2209XA Striking against other stationary object, initial encounter: Secondary | ICD-10-CM | POA: Insufficient documentation

## 2014-10-31 DIAGNOSIS — Z792 Long term (current) use of antibiotics: Secondary | ICD-10-CM | POA: Insufficient documentation

## 2014-10-31 DIAGNOSIS — Z7952 Long term (current) use of systemic steroids: Secondary | ICD-10-CM | POA: Insufficient documentation

## 2014-10-31 DIAGNOSIS — S90122A Contusion of left lesser toe(s) without damage to nail, initial encounter: Secondary | ICD-10-CM | POA: Insufficient documentation

## 2014-10-31 DIAGNOSIS — Z8659 Personal history of other mental and behavioral disorders: Secondary | ICD-10-CM | POA: Insufficient documentation

## 2014-10-31 DIAGNOSIS — Z793 Long term (current) use of hormonal contraceptives: Secondary | ICD-10-CM | POA: Insufficient documentation

## 2014-10-31 DIAGNOSIS — Z720 Tobacco use: Secondary | ICD-10-CM | POA: Insufficient documentation

## 2014-10-31 DIAGNOSIS — Y929 Unspecified place or not applicable: Secondary | ICD-10-CM | POA: Insufficient documentation

## 2014-10-31 DIAGNOSIS — S92912A Unspecified fracture of left toe(s), initial encounter for closed fracture: Secondary | ICD-10-CM

## 2014-10-31 DIAGNOSIS — Y999 Unspecified external cause status: Secondary | ICD-10-CM | POA: Insufficient documentation

## 2014-10-31 DIAGNOSIS — I10 Essential (primary) hypertension: Secondary | ICD-10-CM | POA: Insufficient documentation

## 2014-10-31 DIAGNOSIS — R011 Cardiac murmur, unspecified: Secondary | ICD-10-CM | POA: Insufficient documentation

## 2014-10-31 DIAGNOSIS — S92535A Nondisplaced fracture of distal phalanx of left lesser toe(s), initial encounter for closed fracture: Secondary | ICD-10-CM | POA: Insufficient documentation

## 2014-10-31 DIAGNOSIS — Z880 Allergy status to penicillin: Secondary | ICD-10-CM | POA: Insufficient documentation

## 2014-10-31 MED ORDER — NAPROXEN 375 MG PO TABS: 375.0000 mg | ORAL_TABLET | Freq: Two times a day (BID) | ORAL | Status: DC

## 2014-10-31 MED ORDER — TRAMADOL HCL 50 MG PO TABS: 50.0000 mg | ORAL_TABLET | Freq: Four times a day (QID) | ORAL | Status: DC | PRN

## 2014-10-31 NOTE — ED Provider Notes (Signed)
CSN: [id number]     Arrival date & time 10/31/14  1305 History  By signing my name below, I, [name], attest that this documentation has been prepared under the direction and in the presence of non-physician practitioner, Arthor CaptainAbigail Catalaya Garr, PA-C. Electronically Signed: [name], Scribe. 10/31/2014. 11:19 PM.  Chief Complaint  Patient presents with  . Foot Pain   The history is provided by the patient. No language interpreter was used.    HPI Comments:  Sandra Lee is a 29 y.o. female who presents to the Emergency Department complaining of moderate constant left foot pain s/p injury last night. She states the dryer door came off and landed on her foot. She reports associated swelling and paresthesia in her toes. She has taken goody powder with minimal relief.  Past Medical History  Diagnosis Date  . Heart murmur 29 yrs old    not sure if it is still present.  . Abnormal Pap smear   . Depression     hx of depression - not taking medication  . PIH (pregnancy induced hypertension), antepartum 11/19/2010   History reviewed. No pertinent past surgical history. Family History  Problem Relation Age of Onset  . Depression Mother   . Early death Father     HIV  . Arthritis Sister   . Diabetes Maternal Aunt   . Early death Maternal Aunt   . Alcohol abuse Maternal Uncle   . Diabetes Maternal Uncle   . Diabetes Paternal Aunt   . Diabetes Paternal Uncle   . Arthritis Maternal Grandmother   . Diabetes Maternal Grandmother   . Heart disease Maternal Grandmother   . Stroke Maternal Grandmother   . Diabetes Maternal Grandfather    Social History  Substance Use Topics  . Smoking status: Current Every Day Smoker -- 0.50 packs/day    Types: Cigarettes  . Smokeless tobacco: None  . Alcohol Use: No   OB History    Gravida Para Term Preterm AB TAB SAB Ectopic Multiple Living   1 1 0 1 0 0 0 0 0 1      Review of Systems  Constitutional: Negative for fever and chills.   Musculoskeletal: Positive for myalgias and arthralgias.       LLE    Allergies  Penicillins  Home Medications   Prior to Admission medications   Medication Sig Start Date End Date Taking? Authorizing Provider  amLODipine (NORVASC) 5 MG tablet Take 1 tablet (5 mg total) by mouth daily. 12/01/10 12/01/11  Antionette CharLisa [name], MD  azithromycin (ZITHROMAX) 250 MG tablet Take 1 tablet (250 mg total) by mouth daily. Take first 2 tablets together, then 1 every day until finished. 08/17/14   Hannah Muthersbaugh, PA-C  cetirizine (ZYRTEC ALLERGY) 10 MG tablet Take 1 tablet (10 mg total) by mouth daily. 08/17/14   Hannah Muthersbaugh, PA-C  ciprofloxacin (CIPRO) 500 MG tablet Take 1 tablet (500 mg total) by mouth 2 (two) times daily. 06/06/13   Rodolph BongEvan S Corey, MD  cyclobenzaprine (FLEXERIL) 10 MG tablet Take 1 tablet (10 mg total) by mouth 2 (two) times daily as needed for muscle spasms. 12/20/13   Azalia BilisKevin Campos, MD  diclofenac (VOLTAREN) 50 MG EC tablet Take 1 tablet (50 mg total) by mouth 2 (two) times daily as needed. 08/14/13   Rodolph BongEvan S Corey, MD  doxycycline (VIBRAMYCIN) 100 MG capsule Take 1 capsule (100 mg total) by mouth 2 (two) times daily. 07/17/14   Marlon Peliffany Greene, PA-C  etonogestrel (NEXPLANON) 68 MG IMPL implant  Inject 1 each into the skin once.    Historical Provider, MD  HYDROcodone-acetaminophen (NORCO/VICODIN) 5-325 MG per tablet Take 1 tablet by mouth every 6 (six) hours as needed for moderate pain or severe pain. 08/17/14   Hannah Muthersbaugh, PA-C  ibuprofen (ADVIL,MOTRIN) 600 MG tablet Take 1 tablet (600 mg total) by mouth every 8 (eight) hours as needed. 12/20/13   Azalia Bilis, MD  labetalol (NORMODYNE) 300 MG tablet Take 1 tablet (300 mg total) by mouth 3 (three) times daily. 12/01/10 12/01/11  Antionette Char, MD  naproxen (NAPROSYN) 375 MG tablet Take 1 tablet (375 mg total) by mouth 2 (two) times daily. 10/31/14   Arthor Captain, PA-C  oxymetazoline (AFRIN NASAL SPRAY) 0.05 % nasal spray  Place 1 spray into both nostrils 2 (two) times daily. 07/17/14   Tiffany Neva Seat, PA-C  promethazine-codeine (PHENERGAN WITH CODEINE) 6.25-10 MG/5ML syrup Take 5 mLs by mouth every 6 (six) hours as needed for cough. 07/17/14   Tiffany Neva Seat, PA-C  ranitidine (ZANTAC) 75 MG tablet Take 75 mg by mouth 2 (two) times daily.    Historical Provider, MD  sulfamethoxazole-trimethoprim (SEPTRA DS) 800-160 MG per tablet Take 2 tablets by mouth 2 (two) times daily. 08/14/13   Rodolph Bong, MD  traMADol (ULTRAM) 50 MG tablet Take 1 tablet (50 mg total) by mouth every 6 (six) hours as needed. 10/31/14   Arthor Captain, PA-C  triamcinolone cream (KENALOG) 0.1 % Apply 1 application topically 2 (two) times daily. Until skin looks normal 06/06/13   Rodolph Bong, MD   BP 161/105 mmHg  Pulse 88  Temp(Src) 98.1 F (36.7 C) (Oral)  Resp 18  SpO2 100%  LMP 08/23/2014 Physical Exam  Constitutional: She is oriented to person, place, and time. She appears well-developed and well-nourished. No distress.  HENT:  Head: Normocephalic and atraumatic.  Eyes: Conjunctivae are normal.  Cardiovascular: Normal rate.   Pulmonary/Chest: Effort normal.  Abdominal: She exhibits no distension.  Musculoskeletal:  Significant swelling in the left forth toe with ecchymosis which extends to 1/3 of the dorsum of the left foot; discoloration noted to the base of 3rd and 5th digit; Significant swelling and discoloration to plantar surface of 4th digit as well; No nail injury seen; 2+ DP pulse  Neurological: She is alert and oriented to person, place, and time.  Skin: Skin is warm and dry.  Psychiatric: She has a normal mood and affect.  Nursing note and vitals reviewed.   ED Course  Procedures   DIAGNOSTIC STUDIES:  Oxygen Saturation is 100% on RA, normal by my interpretation.    COORDINATION OF CARE:  2:23 PM Will order XR. Advised pt to keep the extremity elevated and to apply ice. Discussed treatment plan with pt at bedside and  pt agreed to plan.  Labs Review Labs Reviewed - No data to display  Imaging Review Dg Foot Complete Left  10/31/2014  CLINICAL DATA:  Foot injury due to broken dryer lid. Fourth digit pain. Initial encounter. EXAM: LEFT FOOT - COMPLETE 3+ VIEW COMPARISON:  None. FINDINGS: Nondisplaced transverse fracture through the distal phalanx of the fourth digit (the middle and distal phalanges of the fourth digit are non segmented). No other osseous finding. IMPRESSION: Nondisplaced fracture of the fourth digit non segmented distal phalanges. Electronically Signed   By: Marnee Spring M.D.   On: 10/31/2014 14:17   I have personally reviewed and evaluated these images as part of my medical decision-making.   EKG Interpretation None  MDM   Final diagnoses:  Fractured toe, left, closed, initial encounter  Essential hypertension    Patient with nondisplaced fracture of the toe.  Placed in post op shoe and cruthces. Pain meds and home care instructions given  Discussed reasons to follow with ortho and reasons to return to the Ed for immediate medical care.  Lucila Maine, personally performed the services described in this documentation. All medical record entries made by the scribe were at my direction and in my presence.  I have reviewed the chart and discharge instructions and agree that the record reflects my personal performance and is accurate and complete. Arthor Captain.  10/31/2014. 11:21 PM.        Arthor Captain, PA-C 10/31/14 2321  Melene Plan, DO 11/01/14 1610

## 2014-10-31 NOTE — Discharge Instructions (Signed)
Toe Fracture A toe fracture is a break in one of the toe bones (phalanges). CAUSES This condition may be caused by:  Dropping a heavy object on your toe.  Stubbing your toe.  Overusing your toe or doing repetitive exercise.  Twisting or stretching your toe out of place. RISK FACTORS This condition is more likely to develop in people who:  Play contact sports.  Have a bone disease.  Have a low calcium level. SYMPTOMS The main symptoms of this condition are swelling and pain in the toe. The pain may get worse with standing or walking. Other symptoms include:  Bruising.  Stiffness.  Numbness.  A change in the way the toe looks.  Broken bones that poke through the skin.  Blood beneath the toenail. DIAGNOSIS This condition is diagnosed with a physical exam. You may also have X-rays. TREATMENT  Treatment for this condition depends on the type of fracture and its severity. Treatment may involve:  Taping the broken toe to a toe that is next to it (buddy taping). This is the most common treatment for fractures in which the bone has not moved out of place (nondisplaced fracture).  Wearing a shoe that has a wide, rigid sole to protect the toe and to limit its movement.  Wearing a walking cast.  Having a procedure to move the toe back into place.  Surgery. This may be needed:  If there are many pieces of broken bone that are out of place (displaced).  If the toe joint breaks.  If the bone breaks through the skin.  Physical therapy. This is done to help regain movement and strength in the toe. You may need follow-up X-rays to make sure that the bone is healing well and staying in position. HOME CARE INSTRUCTIONS If You Have a Cast:  Do not stick anything inside the cast to scratch your skin. Doing that increases your risk of infection.  Check the skin around the cast every day. Report any concerns to your health care provider. You may put lotion on dry skin around the  edges of the cast. Do not apply lotion to the skin underneath the cast.  Do not put pressure on any part of the cast until it is fully hardened. This may take several hours.  Keep the cast clean and dry. Bathing  Do not take baths, swim, or use a hot tub until your health care provider approves. Ask your health care provider if you can take showers. You may only be allowed to take sponge baths for bathing.  If your health care provider approves bathing and showering, cover the cast or bandage (dressing) with a watertight plastic bag to protect it from water. Do not let the cast or dressing get wet. Managing Pain, Stiffness, and Swelling  If you do not have a cast, apply ice to the injured area, if directed.  Put ice in a plastic bag.  Place a towel between your skin and the bag.  Leave the ice on for 20 minutes, 2-3 times per day.  Move your toes often to avoid stiffness and to lessen swelling.  Raise (elevate) the injured area above the level of your heart while you are sitting or lying down. Driving  Do not drive or operate heavy machinery while taking pain medicine.  Do not drive while wearing a cast on a foot that you use for driving. Activity  Return to your normal activities as directed by your health care provider. Ask your health care  provider what activities are safe for you.  Perform exercises daily as directed by your health care provider or physical therapist. Safety  Do not use the injured limb to support your body weight until your health care provider says that you can. Use crutches or other assistive devices as directed by your health care provider. General Instructions  If your toe was treated with buddy taping, follow your health care provider's instructions for changing the gauze and tape. Change it more often:  The gauze and tape get wet. If this happens, dry the space between the toes.  The gauze and tape are too tight and cause your toe to become pale  or numb.  Wear a protective shoe as directed by your health care provider. If you were not given a protective shoe, wear sturdy, supportive shoes. Your shoes should not pinch your toes and should not fit tightly against your toes.  Do not use any tobacco products, including cigarettes, chewing tobacco, or e-cigarettes. Tobacco can delay bone healing. If you need help quitting, ask your health care provider.  Take medicines only as directed by your health care provider.  Keep all follow-up visits as directed by your health care provider. This is important. SEEK MEDICAL CARE IF:  You have a fever.  Your pain medicine is not helping.  Your toe is cold.  Your toe is numb.  You still have pain after one week of rest and treatment.  You still have pain after your health care provider has said that you can start walking again.  You have pain, tingling, or numbness in your foot that is not going away. SEEK IMMEDIATE MEDICAL CARE IF:  You have severe pain.  You have redness or inflammation in your toe that is getting worse.  You have pain or numbness in your toe that is getting worse.  Your toe turns blue.   This information is not intended to replace advice given to you by your health care provider. Make sure you discuss any questions you have with your health care provider.   Document Released: 12/28/1999 Document Revised: 09/20/2014 Document Reviewed: 10/26/2013 Elsevier Interactive Patient Education 2016 [employer].  Hypertension Hypertension, commonly called high blood pressure, is when the force of blood pumping through your arteries is too strong. Your arteries are the blood vessels that carry blood from your heart throughout your body. A blood pressure reading consists of a higher number over a lower number, such as 110/72. The higher number (systolic) is the pressure inside your arteries when your heart pumps. The lower number (diastolic) is the pressure inside your  arteries when your heart relaxes. Ideally you want your blood pressure below 120/80. Hypertension forces your heart to work harder to pump blood. Your arteries may become narrow or stiff. Having untreated or uncontrolled hypertension can cause heart attack, stroke, kidney disease, and other problems. RISK FACTORS Some risk factors for high blood pressure are controllable. Others are not.  Risk factors you cannot control include:   Race. You may be at higher risk if you are African American.  Age. Risk increases with age.  Gender. Men are at higher risk than women before age 72 years. After age 52, women are at higher risk than men. Risk factors you can control include:  Not getting enough exercise or physical activity.  Being overweight.  Getting too much fat, sugar, calories, or salt in your diet.  Drinking too much alcohol. SIGNS AND SYMPTOMS Hypertension does not usually cause signs or  symptoms. Extremely high blood pressure (hypertensive crisis) may cause headache, anxiety, shortness of breath, and nosebleed. DIAGNOSIS To check if you have hypertension, your health care provider will measure your blood pressure while you are seated, with your arm held at the level of your heart. It should be measured at least twice using the same arm. Certain conditions can cause a difference in blood pressure between your right and left arms. A blood pressure reading that is higher than normal on one occasion does not mean that you need treatment. If it is not clear whether you have high blood pressure, you may be asked to return on a different day to have your blood pressure checked again. Or, you may be asked to monitor your blood pressure at home for 1 or more weeks. TREATMENT Treating high blood pressure includes making lifestyle changes and possibly taking medicine. Living a healthy lifestyle can help lower high blood pressure. You may need to change some of your habits. Lifestyle changes may  include:  Following the DASH diet. This diet is high in fruits, vegetables, and whole grains. It is low in salt, red meat, and added sugars.  Keep your sodium intake below 2,300 mg per day.  Getting at least 30-45 minutes of aerobic exercise at least 4 times per week.  Losing weight if necessary.  Not smoking.  Limiting alcoholic beverages.  Learning ways to reduce stress. Your health care provider may prescribe medicine if lifestyle changes are not enough to get your blood pressure under control, and if one of the following is true:  You are 24-37 years of age and your systolic blood pressure is above 140.  You are 28 years of age or older, and your systolic blood pressure is above 150.  Your diastolic blood pressure is above 90.  You have diabetes, and your systolic blood pressure is over 140 or your diastolic blood pressure is over 90.  You have kidney disease and your blood pressure is above 140/90.  You have heart disease and your blood pressure is above 140/90. Your personal target blood pressure may vary depending on your medical conditions, your age, and other factors. HOME CARE INSTRUCTIONS  Have your blood pressure rechecked as directed by your health care provider.   Take medicines only as directed by your health care provider. Follow the directions carefully. Blood pressure medicines must be taken as prescribed. The medicine does not work as well when you skip doses. Skipping doses also puts you at risk for problems.  Do not smoke.   Monitor your blood pressure at home as directed by your health care provider. SEEK MEDICAL CARE IF:   You think you are having a reaction to medicines taken.  You have recurrent headaches or feel dizzy.  You have swelling in your ankles.  You have trouble with your vision. SEEK IMMEDIATE MEDICAL CARE IF:  You develop a severe headache or confusion.  You have unusual weakness, numbness, or feel faint.  You have severe  chest or abdominal pain.  You vomit repeatedly.  You have trouble breathing. MAKE SURE YOU:   Understand these instructions.  Will watch your condition.  Will get help right away if you are not doing well or get worse.   This information is not intended to replace advice given to you by your health care provider. Make sure you discuss any questions you have with your health care provider.   Document Released: 12/30/2004 Document Revised: 05/16/2014 Document Reviewed: 10/22/2012 Elsevier Interactive Patient  Education 2016 [employer]  DASH Eating Plan DASH stands for "Dietary Approaches to Stop Hypertension." The DASH eating plan is a healthy eating plan that has been shown to reduce high blood pressure (hypertension). Additional health benefits may include reducing the risk of type 2 diabetes mellitus, heart disease, and stroke. The DASH eating plan may also help with weight loss. WHAT DO I NEED TO KNOW ABOUT THE DASH EATING PLAN? For the DASH eating plan, you will follow these general guidelines:  Choose foods with a percent daily value for sodium of less than 5% (as listed on the food label).  Use salt-free seasonings or herbs instead of table salt or sea salt.  Check with your health care provider or pharmacist before using salt substitutes.  Eat lower-sodium products, often labeled as "lower sodium" or "no salt added."  Eat fresh foods.  Eat more vegetables, fruits, and low-fat dairy products.  Choose whole grains. Look for the word "whole" as the first word in the ingredient list.  Choose fish and skinless chicken or [place] more often than red meat. Limit fish, poultry, and meat to 6 oz (170 g) each day.  Limit sweets, desserts, sugars, and sugary drinks.  Choose heart-healthy fats.  Limit cheese to 1 oz (28 g) per day.  Eat more home-cooked food and less restaurant, buffet, and fast food.  Limit fried foods.  Cook foods using methods other than  frying.  Limit canned vegetables. If you do use them, rinse them well to decrease the sodium.  When eating at a restaurant, ask that your food be prepared with less salt, or no salt if possible. WHAT FOODS CAN I EAT? Seek help from a dietitian for individual calorie needs. Grains Whole grain or whole wheat bread. Brown rice. Whole grain or whole wheat pasta. Quinoa, bulgur, and whole grain cereals. Low-sodium cereals. Corn or whole wheat flour tortillas. Whole grain cornbread. Whole grain crackers. Low-sodium crackers. Vegetables Fresh or frozen vegetables (raw, steamed, roasted, or grilled). Low-sodium or reduced-sodium tomato and vegetable juices. Low-sodium or reduced-sodium tomato sauce and paste. Low-sodium or reduced-sodium canned vegetables.  Fruits All fresh, canned (in natural juice), or frozen fruits. Meat and Other Protein Products Ground beef (85% or leaner), grass-fed beef, or beef trimmed of fat. Skinless chicken or [place]. Ground chicken or [place]. Pork trimmed of fat. All fish and seafood. Eggs. Dried beans, peas, or lentils. Unsalted nuts and seeds. Unsalted canned beans. Dairy Low-fat dairy products, such as skim or 1% milk, 2% or reduced-fat cheeses, low-fat ricotta or cottage cheese, or plain low-fat yogurt. Low-sodium or reduced-sodium cheeses. Fats and Oils Tub margarines without trans fats. Light or reduced-fat mayonnaise and salad dressings (reduced sodium). Avocado. Safflower, olive, or canola oils. Natural peanut or almond butter. Other Unsalted popcorn and pretzels. The items listed above may not be a complete list of recommended foods or beverages. Contact your dietitian for more options. WHAT FOODS ARE NOT RECOMMENDED? Grains White bread. White pasta. White rice. Refined cornbread. Bagels and croissants. Crackers that contain trans fat. Vegetables Creamed or fried vegetables. Vegetables in a cheese sauce. Regular canned vegetables. Regular canned tomato sauce  and paste. Regular tomato and vegetable juices. Fruits Dried fruits. Canned fruit in light or heavy syrup. Fruit juice. Meat and Other Protein Products Fatty cuts of meat. Ribs, chicken wings, bacon, sausage, bologna, salami, chitterlings, fatback, hot dogs, bratwurst, and packaged luncheon meats. Salted nuts and seeds. Canned beans with salt. Dairy Whole or 2% milk, cream, half-and-half, and cream  cheese. Whole-fat or sweetened yogurt. Full-fat cheeses or blue cheese. Nondairy creamers and whipped toppings. Processed cheese, cheese spreads, or cheese curds. Condiments Onion and garlic salt, seasoned salt, table salt, and sea salt. Canned and packaged gravies. Worcestershire sauce. Tartar sauce. Barbecue sauce. Teriyaki sauce. Soy sauce, including reduced sodium. Steak sauce. Fish sauce. Oyster sauce. Cocktail sauce. Horseradish. Ketchup and mustard. Meat flavorings and tenderizers. Bouillon cubes. Hot sauce. Tabasco sauce. Marinades. Taco seasonings. Relishes. Fats and Oils Butter, stick margarine, lard, shortening, ghee, and bacon fat. Coconut, palm kernel, or palm oils. Regular salad dressings. Other Pickles and olives. Salted popcorn and pretzels. The items listed above may not be a complete list of foods and beverages to avoid. Contact your dietitian for more information. WHERE CAN I FIND MORE INFORMATION? National Heart, Lung, and Blood Institute: [employer]   This information is not intended to replace advice given to you by your health care provider. Make sure you discuss any questions you have with your health care provider.   Document Released: 12/19/2010 Document Revised: 01/20/2014 Document Reviewed: 11/03/2012 Elsevier Interactive Patient Education [employer]

## 2014-10-31 NOTE — ED Notes (Signed)
Pt reports that a dryer door came off and landed on her foot. Left 4th toe discolored and painful.Also has discoloration to base of toes.

## 2014-10-31 NOTE — ED Notes (Signed)
Pt reports that dryer door broke last night and landed on left foot. Swelling/bruising noted to area. Ambulatory. NAD.

## 2015-04-09 ENCOUNTER — Emergency Department (HOSPITAL_COMMUNITY)
Admission: EM | Admit: 2015-04-09 | Discharge: 2015-04-09 | Disposition: A | Discharge status: Home / Self Care | Payer: BLUE CROSS/BLUE SHIELD | Attending: Emergency Medicine | Admitting: Emergency Medicine

## 2015-04-09 ENCOUNTER — Encounter (HOSPITAL_COMMUNITY): Payer: Self-pay | Admitting: *Deleted

## 2015-04-09 DIAGNOSIS — J0100 Acute maxillary sinusitis, unspecified: Secondary | ICD-10-CM | POA: Insufficient documentation

## 2015-04-09 DIAGNOSIS — Z7952 Long term (current) use of systemic steroids: Secondary | ICD-10-CM | POA: Diagnosis not present

## 2015-04-09 DIAGNOSIS — R51 Headache: Secondary | ICD-10-CM | POA: Diagnosis present

## 2015-04-09 DIAGNOSIS — Z880 Allergy status to penicillin: Secondary | ICD-10-CM | POA: Insufficient documentation

## 2015-04-09 DIAGNOSIS — Z79899 Other long term (current) drug therapy: Secondary | ICD-10-CM | POA: Diagnosis not present

## 2015-04-09 DIAGNOSIS — R011 Cardiac murmur, unspecified: Secondary | ICD-10-CM | POA: Diagnosis not present

## 2015-04-09 DIAGNOSIS — H748X3 Other specified disorders of middle ear and mastoid, bilateral: Secondary | ICD-10-CM | POA: Diagnosis not present

## 2015-04-09 DIAGNOSIS — Z792 Long term (current) use of antibiotics: Secondary | ICD-10-CM | POA: Diagnosis not present

## 2015-04-09 DIAGNOSIS — Z7951 Long term (current) use of inhaled steroids: Secondary | ICD-10-CM | POA: Insufficient documentation

## 2015-04-09 DIAGNOSIS — F329 Major depressive disorder, single episode, unspecified: Secondary | ICD-10-CM | POA: Insufficient documentation

## 2015-04-09 DIAGNOSIS — F17210 Nicotine dependence, cigarettes, uncomplicated: Secondary | ICD-10-CM | POA: Insufficient documentation

## 2015-04-09 MED ORDER — NAPROXEN 250 MG PO TABS: 250.0000 mg | ORAL_TABLET | Freq: Two times a day (BID) | ORAL | Status: DC

## 2015-04-09 MED ORDER — ACETAMINOPHEN 325 MG PO TABS
650.0000 mg | ORAL_TABLET | Freq: Once | ORAL | Status: AC
  Administered 2015-04-09: 650 mg via ORAL
  Filled 2015-04-09: qty 2

## 2015-04-09 MED ORDER — CETIRIZINE HCL 10 MG PO TABS: 10.0000 mg | ORAL_TABLET | Freq: Every day | ORAL | Status: DC

## 2015-04-09 MED ORDER — FLUTICASONE PROPIONATE 50 MCG/ACT NA SUSP: 2.0000 | Freq: Every day | NASAL | Status: DC

## 2015-04-09 NOTE — ED Provider Notes (Signed)
CSN: [id number]     Arrival date & time 04/09/15  1139 History  By signing my name below, I, Tanda Rockers, attest that this documentation has been prepared under the direction and in the presence of Everlene Farrier, PA-C. Electronically Signed: Tanda Rockers, ED Scribe. 04/09/2015. 1:53 PM.   Chief Complaint  Patient presents with  . Headache   The history is provided by the patient. No language interpreter was used.     HPI Comments: Sandra Lee is a 30 y.o. female who presents to the Emergency Department complaining of gradual onset, constant, sinus pressure that began this morning. Pt also complains of nasal congestion and post nasal drip. She has not taken anything for her symptoms today. She has hx of nasal polyps and usually takes Ibuprofen for her pain. No treatments today. Denies fever, sore throat, difficulty swallowing, abdominal pain, nausea, vomiting, double vision, cough, or any other associated symptoms.   Past Medical History  Diagnosis Date  . Heart murmur 30 yrs old    not sure if it is still present.  . Abnormal Pap smear   . Depression     hx of depression - not taking medication  . PIH (pregnancy induced hypertension), antepartum 11/19/2010   History reviewed. No pertinent past surgical history. Family History  Problem Relation Age of Onset  . Depression Mother   . Early death Father     HIV  . Arthritis Sister   . Diabetes Maternal Aunt   . Early death Maternal Aunt   . Alcohol abuse Maternal Uncle   . Diabetes Maternal Uncle   . Diabetes Paternal Aunt   . Diabetes Paternal Uncle   . Arthritis Maternal Grandmother   . Diabetes Maternal Grandmother   . Heart disease Maternal Grandmother   . Stroke Maternal Grandmother   . Diabetes Maternal Grandfather    Social History  Substance Use Topics  . Smoking status: Current Every Day Smoker -- 0.50 packs/day    Types: Cigarettes  . Smokeless tobacco: None  . Alcohol Use: No   OB History    Gravida  Para Term Preterm AB TAB SAB Ectopic Multiple Living       Review of Systems  Constitutional: Negative for fever.  HENT: Positive for congestion, ear pain, postnasal drip, rhinorrhea and sinus pressure. Negative for facial swelling, mouth sores, nosebleeds, sore throat and trouble swallowing.   Eyes: Negative for pain and visual disturbance.  Respiratory: Negative for cough.   Gastrointestinal: Negative for nausea, vomiting and abdominal pain.  Skin: Negative for rash.  Neurological: Negative for syncope, light-headedness and headaches.   Allergies  Penicillins  Home Medications   Prior to Admission medications   Medication Sig Start Date End Date Taking? Authorizing Provider  amLODipine (NORVASC) 5 MG tablet Take 1 tablet (5 mg total) by mouth daily. 12/01/10 12/01/11  Antionette Char, MD  azithromycin (ZITHROMAX) 250 MG tablet Take 1 tablet (250 mg total) by mouth daily. Take first 2 tablets together, then 1 every day until finished. 08/17/14   Hannah Muthersbaugh, PA-C  cetirizine (ZYRTEC ALLERGY) 10 MG tablet Take 1 tablet (10 mg total) by mouth daily. 04/09/15   Everlene Farrier, PA-C  ciprofloxacin (CIPRO) 500 MG tablet Take 1 tablet (500 mg total) by mouth 2 (two) times daily. 06/06/13   Rodolph Bong, MD  cyclobenzaprine (FLEXERIL) 10 MG tablet Take 1 tablet (10 mg total) by mouth 2 (two) times daily as  needed for muscle spasms. 12/20/13   Azalia BilisKevin Campos, MD  diclofenac (VOLTAREN) 50 MG EC tablet Take 1 tablet (50 mg total) by mouth 2 (two) times daily as needed. 08/14/13   Rodolph BongEvan S Corey, MD  doxycycline (VIBRAMYCIN) 100 MG capsule Take 1 capsule (100 mg total) by mouth 2 (two) times daily. 07/17/14   Tiffany Neva SeatGreene, PA-C  etonogestrel (NEXPLANON) 68 MG IMPL implant Inject 1 each into the skin once.    Historical Provider, MD  fluticasone (FLONASE) 50 MCG/ACT nasal spray Place 2 sprays into both nostrils daily. 04/09/15   Everlene FarrierWilliam Kassem Kibbe, PA-C  HYDROcodone-acetaminophen  (NORCO/VICODIN) 5-325 MG per tablet Take 1 tablet by mouth every 6 (six) hours as needed for moderate pain or severe pain. 08/17/14   Hannah Muthersbaugh, PA-C  ibuprofen (ADVIL,MOTRIN) 600 MG tablet Take 1 tablet (600 mg total) by mouth every 8 (eight) hours as needed. 12/20/13   Azalia BilisKevin Campos, MD  labetalol (NORMODYNE) 300 MG tablet Take 1 tablet (300 mg total) by mouth 3 (three) times daily. 12/01/10 12/01/11  Antionette CharLisa [name], MD  naproxen (NAPROSYN) 250 MG tablet Take 1 tablet (250 mg total) by mouth 2 (two) times daily with a meal. 04/09/15   Everlene FarrierWilliam Ezinne Yogi, PA-C  oxymetazoline (AFRIN NASAL SPRAY) 0.05 % nasal spray Place 1 spray into both nostrils 2 (two) times daily. 07/17/14   Tiffany Neva SeatGreene, PA-C  promethazine-codeine (PHENERGAN WITH CODEINE) 6.25-10 MG/5ML syrup Take 5 mLs by mouth every 6 (six) hours as needed for cough. 07/17/14   Tiffany Neva SeatGreene, PA-C  ranitidine (ZANTAC) 75 MG tablet Take 75 mg by mouth 2 (two) times daily.    Historical Provider, MD  sulfamethoxazole-trimethoprim (SEPTRA DS) 800-160 MG per tablet Take 2 tablets by mouth 2 (two) times daily. 08/14/13   Rodolph BongEvan S Corey, MD  traMADol (ULTRAM) 50 MG tablet Take 1 tablet (50 mg total) by mouth every 6 (six) hours as needed. 10/31/14   Arthor CaptainAbigail Harris, PA-C  triamcinolone cream (KENALOG) 0.1 % Apply 1 application topically 2 (two) times daily. Until skin looks normal 06/06/13   Rodolph BongEvan S Corey, MD   BP 158/106 mmHg  Pulse 83  Temp(Src) 98.5 F (36.9 C) (Oral)  Resp 18  SpO2 99%  LMP 03/18/2015   Physical Exam  Constitutional: She appears well-developed and well-nourished. No distress.  Nontoxic appearing.  HENT:  Head: Normocephalic and atraumatic.  Right Ear: External ear normal. A middle ear effusion is present.  Left Ear: External ear normal. A middle ear effusion is present.  Nose: Right sinus exhibits maxillary sinus tenderness. Left sinus exhibits maxillary sinus tenderness.  Mouth/Throat: Oropharynx is clear and moist. No  oropharyngeal exudate.  Mild middle ear effusion bilaterally.  No TM erythema or loss of landmarks bilaterally.  Boggy nasal turbinates bilaterally.  No tonsillar hypertrophy or exudates.  Maxillary sinus tenderness to palpation.   Eyes: Conjunctivae and EOM are normal. Pupils are equal, round, and reactive to light. Right eye exhibits no discharge. Left eye exhibits no discharge.  Neck: Normal range of motion. Neck supple.  Cardiovascular: Normal rate, regular rhythm, normal heart sounds and intact distal pulses.   Pulmonary/Chest: Effort normal and breath sounds normal. No respiratory distress. She has no wheezes. She has no rhonchi. She has no rales.  LCTAB  Abdominal: Soft. There is no tenderness.  Lymphadenopathy:    She has no cervical adenopathy.  Neurological: She is alert. Coordination normal.  Skin: Skin is warm and dry. No rash noted. She is not diaphoretic. No erythema. No  pallor.  Psychiatric: She has a normal mood and affect. Her behavior is normal.  Nursing note and vitals reviewed.   ED Course  Procedures (including critical care time)  DIAGNOSTIC STUDIES: Oxygen Saturation is 99% on RA, normal by my interpretation.    COORDINATION OF CARE: 1:51 PM-Discussed treatment plan which includes Rx nasal steroid, Zyrtec, with pt at bedside and pt agreed to plan.    MDM   Meds given in ED:  Medications  acetaminophen (TYLENOL) tablet 650 mg (650 mg Oral Given 04/09/15 1341)    New Prescriptions   CETIRIZINE (ZYRTEC ALLERGY) 10 MG TABLET    Take 1 tablet (10 mg total) by mouth daily.   FLUTICASONE (FLONASE) 50 MCG/ACT NASAL SPRAY    Place 2 sprays into both nostrils daily.   NAPROXEN (NAPROSYN) 250 MG TABLET    Take 1 tablet (250 mg total) by mouth 2 (two) times daily with a meal.    Final diagnoses:  Acute maxillary sinusitis, recurrence not specified   This is a 30 y.o. female who presents to the Emergency Department complaining of gradual onset, constant, sinus  pressure that began this morning. Pt also complains of nasal congestion and post nasal drip. She has not taken anything for her symptoms today. She has hx of nasal polyps and usually takes Ibuprofen for her pain.  On exam the patient is afebrile and nontoxic appearing. She is maxillary sinus tenderness palpation. Boggy nasal turbinates bilaterally. She is afebrile acid) antibiotics. She has acute sinusitis. Will discharge her prescriptions for Flonase, Zyrtec and naproxen. I encouraged close follow-up by primary care. I advised the patient to follow-up with their primary care provider this week. I advised the patient to return to the emergency department with new or worsening symptoms or new concerns. The patient verbalized understanding and agreement with plan.    I personally performed the services described in this documentation, which was scribed in my presence. The recorded information has been reviewed and is accurate.       Everlene Farrier, PA-C 04/09/15 1401  Arby Barrette, MD 04/11/15 [phone]

## 2015-04-09 NOTE — ED Notes (Signed)
Declined W/C at D/C and was escorted to lobby by RN. 

## 2015-04-09 NOTE — Discharge Instructions (Signed)
Sinusitis, Adult  Sinusitis is redness, soreness, and inflammation of the paranasal sinuses. Paranasal sinuses are air pockets within the bones of your face. They are located beneath your eyes, in the middle of your forehead, and above your eyes. In healthy paranasal sinuses, mucus is able to drain out, and air is able to circulate through them by way of your nose. However, when your paranasal sinuses are inflamed, mucus and air can become trapped. This can allow bacteria and other germs to grow and cause infection.  Sinusitis can develop quickly and last only a short time (acute) or continue over a long period (chronic). Sinusitis that lasts for more than 12 weeks is considered chronic.  CAUSES  Causes of sinusitis include:  · Allergies.  · Structural abnormalities, such as displacement of the cartilage that separates your nostrils (deviated septum), which can decrease the air flow through your nose and sinuses and affect sinus drainage.  · Functional abnormalities, such as when the small hairs (cilia) that line your sinuses and help remove mucus do not work properly or are not present.  SIGNS AND SYMPTOMS  Symptoms of acute and chronic sinusitis are the same. The primary symptoms are pain and pressure around the affected sinuses. Other symptoms include:  · Upper toothache.  · Earache.  · Headache.  · Bad breath.  · Decreased sense of smell and taste.  · A cough, which worsens when you are lying flat.  · Fatigue.  · Fever.  · Thick drainage from your nose, which often is green and may contain pus (purulent).  · Swelling and warmth over the affected sinuses.  DIAGNOSIS  Your health care provider will perform a physical exam. During your exam, your health care provider may perform any of the following to help determine if you have acute sinusitis or chronic sinusitis:  · Look in your nose for signs of abnormal growths in your nostrils (nasal polyps).  · Tap over the affected sinus to check for signs of  infection.  · View the inside of your sinuses using an imaging device that has a light attached (endoscope).  If your health care provider suspects that you have chronic sinusitis, one or more of the following tests may be recommended:  · Allergy tests.  · Nasal culture. A sample of mucus is taken from your nose, sent to a lab, and screened for bacteria.  · Nasal cytology. A sample of mucus is taken from your nose and examined by your health care provider to determine if your sinusitis is related to an allergy.  TREATMENT  Most cases of acute sinusitis are related to a viral infection and will resolve on their own within 10 days. Sometimes, medicines are prescribed to help relieve symptoms of both acute and chronic sinusitis. These may include pain medicines, decongestants, nasal steroid sprays, or saline sprays.  However, for sinusitis related to a bacterial infection, your health care provider will prescribe antibiotic medicines. These are medicines that will help kill the bacteria causing the infection.  Rarely, sinusitis is caused by a fungal infection. In these cases, your health care provider will prescribe antifungal medicine.  For some cases of chronic sinusitis, surgery is needed. Generally, these are cases in which sinusitis recurs more than 3 times per year, despite other treatments.  HOME CARE INSTRUCTIONS  · Drink plenty of water. Water helps thin the mucus so your sinuses can drain more easily.  · Use a humidifier.  · Inhale steam 3-4 times a day (for   example, sit in the bathroom with the shower running).  · Apply a warm, moist washcloth to your face 3-4 times a day, or as directed by your health care provider.  · Use saline nasal sprays to help moisten and clean your sinuses.  · Take medicines only as directed by your health care provider.  · If you were prescribed either an antibiotic or antifungal medicine, finish it all even if you start to feel better.  SEEK IMMEDIATE MEDICAL CARE IF:  · You have  increasing pain or severe headaches.  · You have nausea, vomiting, or drowsiness.  · You have swelling around your face.  · You have vision problems.  · You have a stiff neck.  · You have difficulty breathing.     This information is not intended to replace advice given to you by your health care provider. Make sure you discuss any questions you have with your health care provider.     Document Released: 12/30/2004 Document Revised: 01/20/2014 Document Reviewed: 01/14/2011  Elsevier Interactive Patient Education ©2016 [employer]  Sinus Rinse  WHAT IS A SINUS RINSE?  A sinus rinse is a simple home treatment that is used to rinse your sinuses with a sterile mixture of salt and water (saline solution). Sinuses are air-filled spaces in your skull behind the bones of your face and forehead that open into your nasal cavity.  You will use the following:  · Saline solution.  · Neti pot or spray bottle. This releases the saline solution into your nose and through your sinuses. Neti pots and spray bottles can be purchased at your local pharmacy, a health food store, or online.  WHEN WOULD I DO A SINUS RINSE?  A sinus rinse can help to clear mucus, dirt, dust, or pollen from the nasal cavity. You may do a sinus rinse when you have a cold, a virus, nasal allergy symptoms, a sinus infection, or stuffiness in the nose or sinuses.  If you are considering a sinus rinse:  · Ask your child's health care provider before performing a sinus rinse on your child.  · Do not do a sinus rinse if you have had ear or nasal surgery, ear infection, or blocked ears.  HOW DO I DO A SINUS RINSE?  · Wash your hands.  · Disinfect your device according to the directions provided and then dry it.  · Use the solution that comes with your device or one that is sold separately in stores. Follow the mixing directions on the package.  · Fill your device with the amount of saline solution as directed by the device instructions.  · Stand over a sink and  tilt your head sideways over the sink.  · Place the spout of the device in your upper nostril (the one closer to the ceiling).  · Gently pour or squeeze the saline solution into the nasal cavity. The liquid should drain to the lower nostril if you are not overly congested.  · Gently blow your nose. Blowing too hard may cause ear pain.  · Repeat in the other nostril.  · Clean and rinse your device with clean water and then air-dry it.  ARE THERE RISKS OF A SINUS RINSE?   Sinus rinse is generally very safe and effective. However, there are a few risks, which include:   · A burning sensation in the sinuses. This may happen if you do not make the saline solution as directed. Make sure to follow all directions when   making the saline solution.  · Infection from contaminated water. This is rare, but possible.  · Nasal irritation.     This information is not intended to replace advice given to you by your health care provider. Make sure you discuss any questions you have with your health care provider.     Document Released: 07/27/2013 Document Reviewed: 07/27/2013  Elsevier Interactive Patient Education ©2016 [employer]

## 2015-04-09 NOTE — ED Notes (Signed)
Pt reports sinus pressure and headache, has throbbing headache today. No relief with otc meds. Denies n/v.

## 2015-09-03 ENCOUNTER — Ambulatory Visit (HOSPITAL_COMMUNITY)
Admission: EM | Admit: 2015-09-03 | Discharge: 2015-09-03 | Disposition: A | Discharge status: Home / Self Care | Payer: Medicaid Other | Attending: Emergency Medicine | Admitting: Emergency Medicine

## 2015-09-03 ENCOUNTER — Encounter (HOSPITAL_COMMUNITY): Payer: Self-pay | Admitting: Family Medicine

## 2015-09-03 DIAGNOSIS — N898 Other specified noninflammatory disorders of vagina: Secondary | ICD-10-CM | POA: Diagnosis not present

## 2015-09-03 DIAGNOSIS — A499 Bacterial infection, unspecified: Secondary | ICD-10-CM | POA: Diagnosis not present

## 2015-09-03 DIAGNOSIS — B9689 Other specified bacterial agents as the cause of diseases classified elsewhere: Secondary | ICD-10-CM

## 2015-09-03 DIAGNOSIS — N760 Acute vaginitis: Secondary | ICD-10-CM | POA: Diagnosis not present

## 2015-09-03 LAB — POCT URINALYSIS DIP (DEVICE)
Bilirubin Urine: NEGATIVE
GLUCOSE, UA: NEGATIVE mg/dL
Ketones, ur: NEGATIVE mg/dL
LEUKOCYTES UA: NEGATIVE
Nitrite: NEGATIVE
Protein, ur: NEGATIVE mg/dL
SPECIFIC GRAVITY, URINE: 1.02 (ref 1.005–1.030)
UROBILINOGEN UA: 0.2 mg/dL (ref 0.0–1.0)
pH: 5.5 (ref 5.0–8.0)

## 2015-09-03 LAB — POCT PREGNANCY, URINE: PREG TEST UR: NEGATIVE

## 2015-09-03 MED ORDER — FLUCONAZOLE 150 MG PO TABS: ORAL_TABLET | ORAL | 0 refills | Status: DC

## 2015-09-03 MED ORDER — METRONIDAZOLE 500 MG PO TABS: 500.0000 mg | ORAL_TABLET | Freq: Two times a day (BID) | ORAL | 0 refills | Status: DC

## 2015-09-03 NOTE — ED Triage Notes (Signed)
Pt here for vaginal discharge with odor and dysuria.

## 2015-09-03 NOTE — ED Provider Notes (Signed)
CSN: [id number]     Arrival date & time 09/03/15  1106 History   First MD Initiated Contact with Patient 09/03/15 1331     Chief Complaint  Patient presents with  . Vaginal Discharge   (Consider location/radiation/quality/duration/timing/severity/associated sxs/prior Treatment) 30 year old female complaining of a malodorous vaginal discharge with right pelvic cramping for the past 2-3 days. Denies nausea or vomiting, fever or chills. She does complain of migrating abdominal pain. Last menstrual period end of July 2017.      Past Medical History:  Diagnosis Date  . Abnormal Pap smear   . Depression    hx of depression - not taking medication  . Heart murmur 30 yrs old   not sure if it is still present.  Marland Kitchen. PIH (pregnancy induced hypertension), antepartum 11/19/2010   History reviewed. No pertinent surgical history. Family History  Problem Relation Age of Onset  . Depression Mother   . Early death Father     HIV  . Arthritis Sister   . Diabetes Maternal Aunt   . Early death Maternal Aunt   . Alcohol abuse Maternal Uncle   . Diabetes Maternal Uncle   . Diabetes Paternal Aunt   . Diabetes Paternal Uncle   . Arthritis Maternal Grandmother   . Diabetes Maternal Grandmother   . Heart disease Maternal Grandmother   . Stroke Maternal Grandmother   . Diabetes Maternal Grandfather    Social History  Substance Use Topics  . Smoking status: Current Every Day Smoker    Packs/day: 0.50    Types: Cigarettes  . Smokeless tobacco: Never Used  . Alcohol use No   OB History    Gravida Para Term Preterm AB Living   1 1 0 1 0 1   SAB TAB Ectopic Multiple Live Births   0 0 0 0 1     Review of Systems  Constitutional: Negative.  Negative for activity change.  HENT: Negative.   Respiratory: Negative.   Cardiovascular: Negative.   Gastrointestinal: Positive for abdominal pain. Negative for blood in stool, diarrhea, nausea and vomiting.  Genitourinary: Positive for vaginal  discharge. Negative for dysuria, frequency and menstrual problem.  Skin: Negative.   Neurological: Negative.   All other systems reviewed and are negative.   Allergies  Penicillins  Home Medications   Prior to Admission medications   Medication Sig Start Date End Date Taking? Authorizing Provider  cetirizine (ZYRTEC ALLERGY) 10 MG tablet Take 1 tablet (10 mg total) by mouth daily. 04/09/15   Everlene FarrierWilliam Dansie, PA-C  etonogestrel (NEXPLANON) 68 MG IMPL implant Inject 1 each into the skin once.    Historical Provider, MD  fluconazole (DIFLUCAN) 150 MG tablet 1 tab po x 1. May repeat in 72 hours if no improvement 09/03/15   Hayden Rasmussenavid Ayva Veilleux, NP  fluticasone [facility](FLONASE) 50 MCG/ACT nasal spray Place 2 sprays into both nostrils daily. 04/09/15   Everlene FarrierWilliam Dansie, PA-C  labetalol (NORMODYNE) 300 MG tablet Take 1 tablet (300 mg total) by mouth 3 (three) times daily. 12/01/10 12/01/11  Antionette CharLisa [name], MD  metroNIDAZOLE (FLAGYL) 500 MG tablet Take 1 tablet (500 mg total) by mouth 2 (two) times daily. X 7 days 09/03/15   Hayden Rasmussenavid Eshani Maestre, NP  oxymetazoline (AFRIN NASAL SPRAY) 0.05 % nasal spray Place 1 spray into both nostrils 2 (two) times daily. 07/17/14   Tiffany Neva SeatGreene, PA-C  ranitidine (ZANTAC) 75 MG tablet Take 75 mg by mouth 2 (two) times daily.    Historical Provider, MD   Meds Ordered and  Administered this Visit  Medications - No data to display  BP (!) 156/107   Pulse 80   Temp 98.4 F (36.9 C)   Resp 18   LMP 08/03/2015   SpO2 100%  No data found.   Physical Exam  Constitutional: She is oriented to person, place, and time. She appears well-developed and well-nourished. No distress.  Neck: Normal range of motion. Neck supple.  Cardiovascular: Normal rate.   Pulmonary/Chest: Effort normal. No respiratory distress.  Abdominal: Soft. Bowel sounds are normal. She exhibits no distension. There is no tenderness.  Tympanic in most areas. No abdominal tenderness.  Genitourinary:  Genitourinary Comments:  Normal external female genitalia. There is a small amount of thick gray vaginal discharge. No bleeding. Cervix midline. No ectocervical lesions visible. Negative CMT.  Musculoskeletal: Normal range of motion. She exhibits no edema.  Neurological: She is alert and oriented to person, place, and time. No cranial nerve deficit. She exhibits normal muscle tone.  Skin: Skin is warm and dry.  Psychiatric: She has a normal mood and affect.  Nursing note and vitals reviewed.   Urgent Care Course   Clinical Course    Procedures (including critical care time)  Labs Review Labs Reviewed  POCT URINALYSIS DIP (DEVICE) - Abnormal; Notable for the following:       Result Value   Hgb urine dipstick TRACE (*)    All other components within normal limits  POCT PREGNANCY, URINE    Imaging Review No results found.   Visual Acuity Review  Right Eye Distance:   Left Eye Distance:   Bilateral Distance:    Right Eye Near:   Left Eye Near:    Bilateral Near:         MDM   1. BV (bacterial vaginosis)   2. Vaginal discharge    Meds ordered this encounter  Medications  . metroNIDAZOLE (FLAGYL) 500 MG tablet    Sig: Take 1 tablet (500 mg total) by mouth 2 (two) times daily. X 7 days    Dispense:  14 tablet    Refill:  0    Order Specific Question:   Supervising Provider    Answer:   Linna HoffKINDL, JAMES D [phone][[id number]  . fluconazole (DIFLUCAN) 150 MG tablet    Sig: 1 tab po x 1. May repeat in 72 hours if no improvement    Dispense:  2 tablet    Refill:  0    Order Specific Question:   Supervising Provider    Answer:   Linna HoffKINDL, JAMES D [id number]]   Cervical cytology pending.    Hayden Rasmussenavid Tarina Volk, NP 09/03/15 1352

## 2016-09-30 ENCOUNTER — Other Ambulatory Visit: Payer: Self-pay

## 2017-12-28 ENCOUNTER — Emergency Department (HOSPITAL_COMMUNITY)
Admission: EM | Admit: 2017-12-28 | Discharge: 2017-12-28 | Disposition: A | Discharge status: Home / Self Care | Payer: Medicaid Other | Attending: Emergency Medicine | Admitting: Emergency Medicine

## 2017-12-28 ENCOUNTER — Other Ambulatory Visit: Payer: Self-pay

## 2017-12-28 ENCOUNTER — Encounter (HOSPITAL_COMMUNITY): Payer: Self-pay

## 2017-12-28 DIAGNOSIS — J0100 Acute maxillary sinusitis, unspecified: Secondary | ICD-10-CM | POA: Diagnosis not present

## 2017-12-28 DIAGNOSIS — F17210 Nicotine dependence, cigarettes, uncomplicated: Secondary | ICD-10-CM | POA: Insufficient documentation

## 2017-12-28 DIAGNOSIS — Z79899 Other long term (current) drug therapy: Secondary | ICD-10-CM | POA: Insufficient documentation

## 2017-12-28 DIAGNOSIS — R0981 Nasal congestion: Secondary | ICD-10-CM | POA: Diagnosis present

## 2017-12-28 MED ORDER — DOXYCYCLINE HYCLATE 100 MG PO TABS
100.0000 mg | ORAL_TABLET | Freq: Once | ORAL | Status: AC
  Administered 2017-12-28: 100 mg via ORAL
  Filled 2017-12-28: qty 1

## 2017-12-28 MED ORDER — DOXYCYCLINE HYCLATE 100 MG PO CAPS: 100.0000 mg | ORAL_CAPSULE | Freq: Two times a day (BID) | ORAL | 0 refills | Status: DC

## 2017-12-28 MED ORDER — IBUPROFEN 200 MG PO TABS
600.0000 mg | ORAL_TABLET | Freq: Once | ORAL | Status: AC
  Administered 2017-12-28: 600 mg via ORAL
  Filled 2017-12-28: qty 3

## 2017-12-28 NOTE — ED Provider Notes (Signed)
[facility]-EMERGENCY DEPT Provider Note   CSN: [id number] Arrival date & time: 12/28/17  1555     History   Chief Complaint Chief Complaint  Patient presents with  . Nasal Congestion    HPI Sandra Lee is a 32 y.o. female.  HPI   Sandra Lee is a 32 y.o. female, with a history of depression, presenting to the ED with sinus congestion and drainage for the past 2 to 3 weeks.  Sinus pain, throbbing, moderate to severe, bilateral, nonradiating.  States she has had history of previous staph infection. Denies fever/chills, nausea/vomiting, difficulty breathing or swallowing, dizziness, neck pain/stiffness, ear pain/drainage, cough, headache, chest pain, or any other complaints.   Past Medical History:  Diagnosis Date  . Abnormal Pap smear   . Depression    hx of depression - not taking medication  . Heart murmur 32 yrs old   not sure if it is still present.  Marland Kitchen PIH (pregnancy induced hypertension), antepartum 11/19/2010    Patient Active Problem List   Diagnosis Date Noted  . PIH (pregnancy induced hypertension) 11/27/2010    History reviewed. No pertinent surgical history.   OB History    Gravida  1   Para  1   Term  0   Preterm  1   AB  0   Living  1     SAB  0   TAB  0   Ectopic  0   Multiple  0   Live Births  1            Home Medications    Prior to Admission medications   Medication Sig Start Date End Date Taking? Authorizing Provider  cetirizine (ZYRTEC ALLERGY) 10 MG tablet Take 1 tablet (10 mg total) by mouth daily. 04/09/15   Everlene Farrier, PA-C  doxycycline (VIBRAMYCIN) 100 MG capsule Take 1 capsule (100 mg total) by mouth 2 (two) times daily. 12/28/17   Tasheema Perrone C, PA-C  etonogestrel (NEXPLANON) 68 MG IMPL implant Inject 1 each into the skin once.    [provider]  fluconazole (DIFLUCAN) 150 MG tablet 1 tab po x 1. May repeat in 72 hours if no improvement 09/03/15   Hayden Rasmussen, NP    fluticasone (FLONASE) 50 MCG/ACT nasal spray Place 2 sprays into both nostrils daily. 04/09/15   Everlene Farrier, PA-C  labetalol (NORMODYNE) 300 MG tablet Take 1 tablet (300 mg total) by mouth 3 (three) times daily. 12/01/10 12/01/11  Antionette Char, MD  metroNIDAZOLE (FLAGYL) 500 MG tablet Take 1 tablet (500 mg total) by mouth 2 (two) times daily. X 7 days 09/03/15   Hayden Rasmussen, NP  oxymetazoline (AFRIN NASAL SPRAY) 0.05 % nasal spray Place 1 spray into both nostrils 2 (two) times daily. 07/17/14   Marlon Pel, PA-C  ranitidine (ZANTAC) 75 MG tablet Take 75 mg by mouth 2 (two) times daily.    [provider]    Family History Family History  Problem Relation Age of Onset  . Depression Mother   . Early death Father        HIV  . Arthritis Sister   . Diabetes Maternal Aunt   . Early death Maternal Aunt   . Alcohol abuse Maternal Uncle   . Diabetes Maternal Uncle   . Diabetes Paternal Aunt   . Diabetes Paternal Uncle   . Arthritis Maternal Grandmother   . Diabetes Maternal Grandmother   . Heart disease Maternal Grandmother   .  Stroke Maternal Grandmother   . Diabetes Maternal Grandfather     Social History Social History   Tobacco Use  . Smoking status: Current Every Day Smoker    Packs/day: 0.50    Types: Cigarettes  . Smokeless tobacco: Never Used  Substance Use Topics  . Alcohol use: No  . Drug use: No     Allergies   Penicillins   Review of Systems Review of Systems  Constitutional: Negative for chills, diaphoresis and fever.  HENT: Positive for congestion, rhinorrhea and sinus pain. Negative for ear pain and facial swelling.   Respiratory: Negative for cough and shortness of breath.   Cardiovascular: Negative for chest pain.  Gastrointestinal: Negative for abdominal pain, diarrhea, nausea and vomiting.  Musculoskeletal: Negative for neck pain and neck stiffness.  Skin: Negative for rash.  All other systems reviewed and are  negative.    Physical Exam Updated Vital Signs BP (!) 137/111 (BP Location: Left Arm)   Pulse (!) 105   Temp 98.3 F (36.8 C) (Oral)   Resp 15   Ht 5\' 5"  (1.651 m)   Wt 63.5 kg   LMP 12/13/2017 (Approximate)   SpO2 98%   BMI 23.30 kg/m   Physical Exam Vitals signs and nursing note reviewed.  Constitutional:      General: She is not in acute distress.    Appearance: She is well-developed. She is not diaphoretic.  HENT:     Head: Normocephalic and atraumatic.     Right Ear: Tympanic membrane, ear canal and external ear normal.     Left Ear: Tympanic membrane, ear canal and external ear normal.     Nose: Mucosal edema and rhinorrhea present. Rhinorrhea is purulent.     Right Sinus: Maxillary sinus tenderness present.     Left Sinus: Maxillary sinus tenderness present.  Eyes:     Conjunctiva/sclera: Conjunctivae normal.  Neck:     Musculoskeletal: Neck supple.  Cardiovascular:     Rate and Rhythm: Normal rate and regular rhythm.  Pulmonary:     Effort: Pulmonary effort is normal. No respiratory distress.     Breath sounds: Normal breath sounds.  Abdominal:     Palpations: Abdomen is soft.     Tenderness: There is no abdominal tenderness. There is no guarding.  Lymphadenopathy:     Cervical: No cervical adenopathy.  Skin:    General: Skin is warm and dry.  Neurological:     Mental Status: She is alert.  Psychiatric:        Behavior: Behavior normal.      ED Treatments / Results  Labs (all labs ordered are listed, but only abnormal results are displayed) Labs Reviewed - No data to display  EKG None  Radiology No results found.  Procedures Procedures (including critical care time)  Medications Ordered in ED Medications  ibuprofen (ADVIL,MOTRIN) tablet 600 mg (600 mg Oral Given 12/28/17 1940)  doxycycline (VIBRA-TABS) tablet 100 mg (100 mg Oral Given 12/28/17 1940)     Initial Impression / Assessment and Plan / ED Course  I have reviewed the triage  vital signs and the nursing notes.  Pertinent labs & imaging results that were available during my care of the patient were reviewed by me and considered in my medical decision making (see chart for details).     Patient presents with sinus pain, congestion, and drainage for the past 2 to 3 weeks.  She is nontoxic-appearing, afebrile, not tachypneic, not hypotensive. Suspect sinusitis with higher suspicion for  bacterial origin.  Recommend PCP versus ENT follow-up for reevaluation and to ensure symptoms are resolving. The patient was given instructions for home care as well as return precautions. Patient voices understanding of these instructions, accepts the plan, and is comfortable with discharge.   Final Clinical Impressions(s) / ED Diagnoses   Final diagnoses:  Acute non-recurrent maxillary sinusitis    ED Discharge Orders         Ordered    doxycycline (VIBRAMYCIN) 100 MG capsule  2 times daily     12/28/17 [address] [place] 12/29/17 1926    Raeford Razor, MD 01/05/18 1513

## 2017-12-28 NOTE — Discharge Instructions (Addendum)
°  Hand washing: Wash your hands throughout the day, but especially before and after touching the face, using the restroom, sneezing, coughing, or touching surfaces that have been coughed or sneezed upon. Hydration: Symptoms will be intensified and complicated by dehydration. Dehydration can also extend the duration of symptoms. Drink plenty of fluids and get plenty of rest. You should be drinking at least half a liter of water an hour to stay hydrated. Electrolyte drinks (ex. Gatorade, Powerade, Pedialyte) are also encouraged. You should be drinking enough fluids to make your urine light yellow, almost clear. If this is not the case, you are not drinking enough water. Please note that some of the treatments indicated below will not be effective if you are not adequately hydrated. Pain or fever: Ibuprofen, Naproxen, or acetaminophen (generic for Tylenol) for pain or fever.  Antiinflammatory medications: Take 600 mg of ibuprofen every 6 hours or 440 mg (over the counter dose) to 500 mg (prescription dose) of naproxen every 12 hours for the next 3 days. After this time, these medications may be used as needed for pain. Take these medications with food to avoid upset stomach. Choose only one of these medications, do not take them together. Acetaminophen (generic for Tylenol): Should you continue to have additional pain while taking the ibuprofen or naproxen, you may add in acetaminophen as needed. Your daily total maximum amount of acetaminophen from all sources should be limited to 4000mg /day for persons without liver problems, or 2000mg /day for those with liver problems. Prednisone: Take the prednisone, as directed, in its entirety. Zyrtec or Claritin: May add these medication daily to control underlying symptoms of congestion, sneezing, and other signs of allergies.  These medications are available over-the-counter. Generics: Cetirizine (generic for Zyrtec) and loratadine (generic for Claritin). Fluticasone:  Use fluticasone (generic for Flonase), as directed, for nasal and sinus congestion.  This medication is available over-the-counter. Congestion: Plain guaifenesin (generic for plain Mucinex) may help relieve congestion. Saline sinus rinses and saline nasal sprays may also help relieve congestion.  Sore throat: Warm liquids or Chloraseptic spray may help soothe a sore throat. Gargle twice a day with a salt water solution made from a half teaspoon of salt in a cup of warm water.  Follow up: Follow-up with the ear nose and throat specialist on this matter soon as possible. Return: Return to the ED worsening pain after 48 hours of antibiotics, increased drainage from the nose, fever over 101.5 F, or any other major concerns.  For prescription assistance, may try using prescription discount sites or apps, such as goodrx.com

## 2017-12-28 NOTE — ED Triage Notes (Signed)
patient c/o nasal congestion x 2-3 weeks and states has gotten worse recently.

## 2018-12-01 ENCOUNTER — Other Ambulatory Visit: Payer: Self-pay

## 2018-12-01 ENCOUNTER — Ambulatory Visit (HOSPITAL_COMMUNITY)
Admission: EM | Admit: 2018-12-01 | Discharge: 2018-12-01 | Disposition: A | Discharge status: Home / Self Care | Payer: Medicaid Other | Attending: Family Medicine | Admitting: Family Medicine

## 2018-12-01 ENCOUNTER — Encounter (HOSPITAL_COMMUNITY): Payer: Self-pay

## 2018-12-01 ENCOUNTER — Ambulatory Visit (INDEPENDENT_AMBULATORY_CARE_PROVIDER_SITE_OTHER): Payer: Medicaid Other

## 2018-12-01 DIAGNOSIS — S42254A Nondisplaced fracture of greater tuberosity of right humerus, initial encounter for closed fracture: Secondary | ICD-10-CM

## 2018-12-01 DIAGNOSIS — S4991XA Unspecified injury of right shoulder and upper arm, initial encounter: Secondary | ICD-10-CM | POA: Diagnosis not present

## 2018-12-01 MED ORDER — IBUPROFEN 800 MG PO TABS: 800.0000 mg | ORAL_TABLET | Freq: Three times a day (TID) | ORAL | 0 refills | Status: DC

## 2018-12-01 MED ORDER — IBUPROFEN 800 MG PO TABS
800.0000 mg | ORAL_TABLET | Freq: Once | ORAL | Status: AC
  Administered 2018-12-01: 800 mg via ORAL

## 2018-12-01 MED ORDER — IBUPROFEN 800 MG PO TABS
ORAL_TABLET | ORAL | Status: AC
  Filled 2018-12-01: qty 1

## 2018-12-01 MED ORDER — HYDROCODONE-ACETAMINOPHEN 5-325 MG PO TABS: 1.0000 | ORAL_TABLET | Freq: Four times a day (QID) | ORAL | 0 refills | Status: DC | PRN

## 2018-12-01 NOTE — ED Notes (Signed)
Discharged by other, assisted in removal from [facility]
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
## 2018-12-01 NOTE — Discharge Instructions (Addendum)
Follow up with orthopedics Use anti-inflammatories for pain/swelling. You may take up to 800 mg Ibuprofen every 8 hours with food. You may supplement Ibuprofen with Tylenol [phone] every 8 hours.   Hydrocodone for severe pain  Wear sling, apply ice
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
## 2019-01-29 ENCOUNTER — Encounter (HOSPITAL_COMMUNITY): Payer: Self-pay | Admitting: *Deleted

## 2019-01-29 ENCOUNTER — Emergency Department (HOSPITAL_COMMUNITY): Payer: Medicaid Other

## 2019-01-29 ENCOUNTER — Other Ambulatory Visit: Payer: Self-pay

## 2019-01-29 ENCOUNTER — Emergency Department (HOSPITAL_COMMUNITY)
Admission: EM | Admit: 2019-01-29 | Discharge: 2019-01-29 | Disposition: A | Discharge status: Home / Self Care | Payer: Medicaid Other | Attending: Emergency Medicine | Admitting: Emergency Medicine

## 2019-01-29 DIAGNOSIS — Z20822 Contact with and (suspected) exposure to covid-19: Secondary | ICD-10-CM | POA: Insufficient documentation

## 2019-01-29 DIAGNOSIS — Z79899 Other long term (current) drug therapy: Secondary | ICD-10-CM | POA: Diagnosis not present

## 2019-01-29 DIAGNOSIS — F17210 Nicotine dependence, cigarettes, uncomplicated: Secondary | ICD-10-CM | POA: Diagnosis not present

## 2019-01-29 DIAGNOSIS — J0191 Acute recurrent sinusitis, unspecified: Secondary | ICD-10-CM | POA: Diagnosis not present

## 2019-01-29 DIAGNOSIS — Z7952 Long term (current) use of systemic steroids: Secondary | ICD-10-CM | POA: Insufficient documentation

## 2019-01-29 DIAGNOSIS — R519 Headache, unspecified: Secondary | ICD-10-CM | POA: Diagnosis present

## 2019-01-29 DIAGNOSIS — R042 Hemoptysis: Secondary | ICD-10-CM | POA: Diagnosis not present

## 2019-01-29 DIAGNOSIS — M3130 Wegener's granulomatosis without renal involvement: Secondary | ICD-10-CM | POA: Diagnosis not present

## 2019-01-29 LAB — CBC WITH DIFFERENTIAL/PLATELET
Abs Immature Granulocytes: 0.09 10*3/uL — ABNORMAL HIGH (ref 0.00–0.07)
Basophils Absolute: 0.1 10*3/uL (ref 0.0–0.1)
Basophils Relative: 0 %
Eosinophils Absolute: 0.3 10*3/uL (ref 0.0–0.5)
Eosinophils Relative: 2 %
HCT: 39.7 % (ref 36.0–46.0)
Hemoglobin: 13.6 g/dL (ref 12.0–15.0)
Immature Granulocytes: 1 %
Lymphocytes Relative: 21 %
Lymphs Abs: 3.5 10*3/uL (ref 0.7–4.0)
MCH: 27.2 pg (ref 26.0–34.0)
MCHC: 34.3 g/dL (ref 30.0–36.0)
MCV: 79.4 fL — ABNORMAL LOW (ref 80.0–100.0)
Monocytes Absolute: 0.9 10*3/uL (ref 0.1–1.0)
Monocytes Relative: 6 %
Neutro Abs: 11.6 10*3/uL — ABNORMAL HIGH (ref 1.7–7.7)
Neutrophils Relative %: 70 %
Platelets: 448 10*3/uL — ABNORMAL HIGH (ref 150–400)
RBC: 5 MIL/uL (ref 3.87–5.11)
RDW: 14.6 % (ref 11.5–15.5)
WBC: 16.4 10*3/uL — ABNORMAL HIGH (ref 4.0–10.5)
nRBC: 0 % (ref 0.0–0.2)

## 2019-01-29 LAB — URINALYSIS, ROUTINE W REFLEX MICROSCOPIC
Bacteria, UA: NONE SEEN
Bilirubin Urine: NEGATIVE
Glucose, UA: NEGATIVE mg/dL
Ketones, ur: NEGATIVE mg/dL
Leukocytes,Ua: NEGATIVE
Nitrite: NEGATIVE
Protein, ur: 30 mg/dL — AB
Specific Gravity, Urine: 1.009 (ref 1.005–1.030)
pH: 6 (ref 5.0–8.0)

## 2019-01-29 LAB — BASIC METABOLIC PANEL
Anion gap: 15 (ref 5–15)
BUN: 8 mg/dL (ref 6–20)
CO2: 24 mmol/L (ref 22–32)
Calcium: 9.7 mg/dL (ref 8.9–10.3)
Chloride: 98 mmol/L (ref 98–111)
Creatinine, Ser: 0.9 mg/dL (ref 0.44–1.00)
GFR calc Af Amer: >60 mL/min (ref >60)
GFR calc non Af Amer: >60 mL/min (ref >60)
Glucose, Bld: 123 mg/dL — ABNORMAL HIGH (ref 70–99)
Potassium: 3.3 mmol/L — ABNORMAL LOW (ref 3.5–5.1)
Sodium: 137 mmol/L (ref 135–145)

## 2019-01-29 LAB — POC SARS CORONAVIRUS 2 AG -  ED: SARS Coronavirus 2 Ag: NEGATIVE

## 2019-01-29 LAB — GROUP A STREP BY PCR: Group A Strep by PCR: NOT DETECTED

## 2019-01-29 MED ORDER — OXYCODONE-ACETAMINOPHEN 5-325 MG PO TABS
1.0000 | ORAL_TABLET | Freq: Once | ORAL | Status: AC
  Administered 2019-01-29: 1 via ORAL
  Filled 2019-01-29: qty 1

## 2019-01-29 MED ORDER — CLINDAMYCIN HCL 300 MG PO CAPS: 300.0000 mg | ORAL_CAPSULE | Freq: Three times a day (TID) | ORAL | 0 refills | Status: DC

## 2019-01-29 MED ORDER — CLINDAMYCIN PHOSPHATE 600 MG/50ML IV SOLN
600.0000 mg | Freq: Once | INTRAVENOUS | Status: AC
  Administered 2019-01-29: 600 mg via INTRAVENOUS
  Filled 2019-01-29: qty 50

## 2019-01-29 MED ORDER — PREDNISONE 20 MG PO TABS
60.0000 mg | ORAL_TABLET | Freq: Once | ORAL | Status: AC
  Administered 2019-01-29: 60 mg via ORAL
  Filled 2019-01-29: qty 3

## 2019-01-29 MED ORDER — SODIUM CHLORIDE 0.9 % IV BOLUS
1000.0000 mL | Freq: Once | INTRAVENOUS | Status: AC
  Administered 2019-01-29: 1000 mL via INTRAVENOUS

## 2019-01-29 MED ORDER — IOHEXOL 300 MG/ML  SOLN
75.0000 mL | Freq: Once | INTRAMUSCULAR | Status: AC | PRN
  Administered 2019-01-29: 18:00:00 75 mL via INTRAVENOUS

## 2019-01-29 MED ORDER — MORPHINE SULFATE (PF) 4 MG/ML IV SOLN
4.0000 mg | Freq: Once | INTRAVENOUS | Status: AC
  Administered 2019-01-29: 4 mg via INTRAVENOUS
  Filled 2019-01-29: qty 1

## 2019-01-29 MED ORDER — METHYLPREDNISOLONE 4 MG PO TBPK: ORAL_TABLET | ORAL | 0 refills | Status: DC

## 2019-01-29 MED ORDER — OXYCODONE-ACETAMINOPHEN 5-325 MG PO TABS: 1.0000 | ORAL_TABLET | Freq: Four times a day (QID) | ORAL | 0 refills | Status: DC | PRN

## 2019-01-29 NOTE — ED Triage Notes (Signed)
The pt is c/o a cold sinus congestion throat fraw from mucous rt sided face pain all since yesterday  No fever reducer since yesterday crying in triage lmpnow

## 2019-01-29 NOTE — ED Notes (Signed)
Patient verbalizes understanding of discharge instructions. Opportunity for questioning and answers were provided. Armband removed by staff, pt discharged from ED. Pt. ambulatory and discharged home.  

## 2019-01-29 NOTE — ED Provider Notes (Signed)
[facility] EMERGENCY DEPARTMENT Provider Note   CSN: [id number] Arrival date & time: 01/29/19  1506     History Chief Complaint  Patient presents with  . Facial Pain    AVANGELINA FLIGHT is a 34 y.o. female.  ANNASTYN SILVEY is a 34 y.o. female with a history of granulomatosis with polyangiitis, chronic sinusitis, heart murmur, who presents to the ED for evaluation of worsening sinus congestion over the past 2 to 3 days, with severe right-sided facial pain starting yesterday.  She states significant amounts of bloody purulent drainage from the right nasal passage and worsening swelling of her left nasal passage.  She reports sinus drainage has caused throat irritation and she has been coughing up some chunks of mucus and blood.  She has not had any fevers or chills.  She denies chest pain or shortness of breath.  She reports she has a constant throbbing ache over the right side of her face.  She states she feels like something is wrong and this is worse than usual.  She states she is followed by Dr. Janace Hoard with ENT for her chronic sinusitis from her granulomatosis, but has not seen him in about a year, they had previously discussed doing a debridement under anesthesia but this was not completed.  She has not been around anyone with Covid that she knows of.  She states that she is on 5 mg of prednisone daily and is also on methotrexate, she has not taken any additional medications to treat her symptoms.  No other aggravating relieving factors.        Past Medical History:  Diagnosis Date  . Abnormal Pap smear   . Depression    hx of depression - not taking medication  . Heart murmur 34 yrs old   not sure if it is still present.  Marland Kitchen PIH (pregnancy induced hypertension), antepartum 11/19/2010    Patient Active Problem List   Diagnosis Date Noted  . PIH (pregnancy induced hypertension) 11/27/2010    History reviewed. No pertinent surgical history.   OB History     Gravida  1   Para  1   Term  0   Preterm  1   AB  0   Living  1     SAB  0   TAB  0   Ectopic  0   Multiple  0   Live Births  1           Family History  Problem Relation Age of Onset  . Depression Mother   . Early death Father        HIV  . Arthritis Sister   . Diabetes Maternal Aunt   . Early death Maternal Aunt   . Alcohol abuse Maternal Uncle   . Diabetes Maternal Uncle   . Diabetes Paternal Aunt   . Diabetes Paternal Uncle   . Arthritis Maternal Grandmother   . Diabetes Maternal Grandmother   . Heart disease Maternal Grandmother   . Stroke Maternal Grandmother   . Diabetes Maternal Grandfather     Social History   Tobacco Use  . Smoking status: Current Every Day Smoker    Packs/day: 0.50    Types: Cigarettes  . Smokeless tobacco: Never Used  Substance Use Topics  . Alcohol use: No  . Drug use: No    Home Medications Prior to Admission medications   Medication Sig Start Date End Date Taking? Authorizing Provider  etonogestrel (NEXPLANON) 1 MG  IMPL implant 68 mg by Subdermal route once.    Yes [provider]  folic acid (FOLVITE) 1 MG tablet Take 1 mg by mouth daily. 01/19/19  Yes [provider]  hydrochlorothiazide (HYDRODIURIL) 25 MG tablet Take 25 mg by mouth daily. 01/19/19  Yes [provider]  methotrexate (RHEUMATREX) 2.5 MG tablet Take 20 mg by mouth once a week. Wednesdays 01/19/19  Yes [provider]  Multiple Vitamin (MULTIVITAMIN ADULT PO) Take 1 tablet by mouth daily.   Yes [provider]  predniSONE (DELTASONE) 5 MG tablet Take 5 mg by mouth daily. 01/19/19  Yes [provider]  cetirizine (ZYRTEC ALLERGY) 10 MG tablet Take 1 tablet (10 mg total) by mouth daily. Patient not taking: Reported on 01/29/2019 04/09/15   Waynetta Pean, PA-C  clindamycin (CLEOCIN) 300 MG capsule Take 1 capsule (300 mg total) by mouth 3 (three) times daily. X 7 days 01/29/19   Jacqlyn Larsen, PA-C   fluticasone [facility]) 50 MCG/ACT nasal spray Place 2 sprays into both nostrils daily. Patient not taking: Reported on 01/29/2019 04/09/15   Waynetta Pean, PA-C  HYDROcodone-acetaminophen (NORCO/VICODIN) 5-325 MG tablet Take 1 tablet by mouth every 6 (six) hours as needed for severe pain. Patient not taking: Reported on 01/29/2019 12/01/18   Wieters, Hallie C, PA-C  ibuprofen (ADVIL) 800 MG tablet Take 1 tablet (800 mg total) by mouth 3 (three) times daily. Patient not taking: Reported on 01/29/2019 12/01/18   Wieters, Hallie C, PA-C  labetalol (NORMODYNE) 300 MG tablet Take 1 tablet (300 mg total) by mouth 3 (three) times daily. Patient not taking: Reported on 01/29/2019 12/01/10 01/29/19  Lahoma Crocker, MD  methylPREDNISolone (MEDROL DOSEPAK) 4 MG TBPK tablet Take as directed 01/29/19   Jacqlyn Larsen, PA-C  oxyCODONE-acetaminophen (PERCOCET) 5-325 MG tablet Take 1 tablet by mouth every 6 (six) hours as needed. 01/29/19   Jacqlyn Larsen, PA-C  oxymetazoline (AFRIN NASAL SPRAY) 0.05 % nasal spray Place 1 spray into both nostrils 2 (two) times daily. Patient not taking: Reported on 01/29/2019 07/17/14   Delos Haring, PA-C    Allergies    Penicillins  Review of Systems   Review of Systems  Constitutional: Negative for chills and fever.  HENT: Positive for congestion, rhinorrhea, sinus pressure, sinus pain and sore throat. Negative for ear pain and facial swelling.   Respiratory: Positive for cough. Negative for shortness of breath.   Cardiovascular: Negative for chest pain.  Gastrointestinal: Negative for abdominal pain, nausea and vomiting.  Musculoskeletal: Negative for myalgias.  Skin: Negative for color change and rash.    Physical Exam Updated Vital Signs BP (!) 170/98 (BP Location: Right Arm)   Pulse (!) 122   Temp 98.9 F (37.2 C) (Oral)   Resp 18   Ht _0  (1.651 m)   Wt 68 kg   LMP 01/29/2019   SpO2 97%   BMI 24.96 kg/m   Physical Exam Vitals and nursing note  reviewed.  Constitutional:      General: She is not in acute distress.    Appearance: Normal appearance. She is well-developed and normal weight. She is not ill-appearing or diaphoretic.     Comments: Patient tearful and anxious on arrival  HENT:     Head: Normocephalic and atraumatic.     Nose: Congestion and rhinorrhea present.     Comments: Patient with bloody purulent nasal discharge from the right nasal passage with surrounding edema.  Left nasal passage with significant edema, unable to  visualize.  Tenderness to palpation over the maxillary sinus on the right.  No crepitance.  No tenderness over the frontal sinus.    Mouth/Throat:     Comments: Posterior oropharynx with some erythema and postnasal drip, no exudates or tonsillar edema, uvula midline Eyes:     General:        Right eye: No discharge.        Left eye: No discharge.     Pupils: Pupils are equal, round, and reactive to light.  Neck:     Comments: Mild cervical adenopathy noted on the right, no masses or rigidity Cardiovascular:     Rate and Rhythm: Normal rate and regular rhythm.     Heart sounds: Normal heart sounds.  Pulmonary:     Effort: Pulmonary effort is normal. No respiratory distress.     Breath sounds: Normal breath sounds. No wheezing or rales.     Comments: Respirations equal and unlabored, patient able to speak in full sentences, lungs clear to auscultation bilaterally Abdominal:     General: Bowel sounds are normal. There is no distension.     Palpations: Abdomen is soft. There is no mass.     Tenderness: There is no abdominal tenderness. There is no guarding.     Comments: Abdomen soft, nondistended, nontender to palpation in all quadrants without guarding or peritoneal signs  Musculoskeletal:        General: No deformity.     Cervical back: Neck supple.  Skin:    General: Skin is warm and dry.     Capillary Refill: Capillary refill takes less than 2 seconds.  Neurological:     Mental Status: She  is alert.     Coordination: Coordination normal.     Comments: Speech is clear, able to follow commands Moves extremities without ataxia, coordination intact  Psychiatric:        Mood and Affect: Mood is anxious. Affect is tearful.     ED Results / Procedures / Treatments   Labs (all labs ordered are listed, but only abnormal results are displayed) Labs Reviewed  BASIC METABOLIC PANEL - Abnormal; Notable for the following components:      Result Value   Potassium 3.3 (*)    Glucose, Bld 123 (*)    All other components within normal limits  CBC WITH DIFFERENTIAL/PLATELET - Abnormal; Notable for the following components:   WBC 16.4 (*)    MCV 79.4 (*)    Platelets 448 (*)    Neutro Abs 11.6 (*)    Abs Immature Granulocytes 0.09 (*)    All other components within normal limits  URINALYSIS, ROUTINE W REFLEX MICROSCOPIC - Abnormal; Notable for the following components:   Color, Urine STRAW (*)    Hgb urine dipstick LARGE (*)    Protein, ur 30 (*)    All other components within normal limits  GROUP A STREP BY PCR  POC SARS CORONAVIRUS 2 AG -  ED    EKG None  Radiology CT Maxillofacial W Contrast  Result Date: 01/29/2019 CLINICAL DATA:  Nasal polyps and sinus infection. Two day history of bloody purulence nasal drainage. Right-sided facial pain. EXAM: CT MAXILLOFACIAL WITH CONTRAST TECHNIQUE: Multidetector CT imaging of the maxillofacial structures was performed with intravenous contrast. Multiplanar CT image reconstructions were also generated. CONTRAST:  27m OMNIPAQUE IOHEXOL 300 MG/ML  SOLN COMPARISON:  02/18/2009 FINDINGS: Osseous: No primary bone finding.  No traumatic change. Orbits: Normal Sinuses: Frontal sinuses are clear. Scattered opacified ethmoid sinuses  without pronounced disease. Maxillary sinuses are clear except for minimal mucosal thickening and tiny retention cysts at the floors, not likely significant. Sphenoid sinus is clear. Soft tissues: Markedly abnormal  nasal passages. Marked swelling the narrow res on the left. Anterior nasal passages shows subtotal occlusion by mucosal thickening and/or polyps. A patent airway to the left of midline curves over to the right and is patent through the inferior nasal passages on the right. No patency on the left posteriorly. Other regional soft tissues appear negative. No other mucosal lesion in the region. Slightly prominent bilateral submandibular lymph nodes. Submandibular glands themselves appear normal. Parotid glands appear normal. Limited intracranial: Negative IMPRESSION: Markedly abnormal appearance of the nasal passages. Pronounced soft tissue thickening with near nasal obstruction. Small patent nasal passage extends from the right naris ease to the left of midline in the anterior nasal passages in then back to the right of midline in the inferior nasal passages. This is presumed to represent advanced mucosal and submucosal infectious inflammatory disease. Certainly there could be coexistent polyps. Electronically Signed   By: Nelson Chimes M.D.   On: 01/29/2019 18:29   DG Chest Port 1 View  Result Date: 01/29/2019 CLINICAL DATA:  Productive cough. EXAM: PORTABLE CHEST 1 VIEW COMPARISON:  June 23, 2017 FINDINGS: The heart size and mediastinal contours are within normal limits. Both lungs are clear. The visualized skeletal structures are unremarkable. IMPRESSION: No active disease. Electronically Signed   By: Fidela Salisbury M.D.   On: 01/29/2019 17:36    Procedures Procedures (including critical care time)  Medications Ordered in ED Medications  sodium chloride 0.9 % bolus 1,000 mL (0 mLs Intravenous Stopped 01/29/19 2010)  morphine 4 MG/ML injection 4 mg (4 mg Intravenous Given 01/29/19 1719)  iohexol (OMNIPAQUE) 300 MG/ML solution 75 mL (75 mLs Intravenous Contrast Given 01/29/19 1819)  predniSONE (DELTASONE) tablet 60 mg (60 mg Oral Given 01/29/19 1911)  clindamycin (CLEOCIN) IVPB 600 mg (0 mg Intravenous  Stopped 01/29/19 2000)  oxyCODONE-acetaminophen (PERCOCET/ROXICET) 5-325 MG per tablet 1 tablet (1 tablet Oral Given 01/29/19 1937)    ED Course  I have reviewed the triage vital signs and the nursing notes.  Pertinent labs & imaging results that were available during my care of the patient were reviewed by me and considered in my medical decision making (see chart for details).  Clinical Course as of Jan 28 2217  Sat Jan 29, 2019  1730 Leukocytosis of 16.4, may be related to acute infection.  Patient is also on chronic steroids.  CBC with Differential(!) [KF]  1745 Mild hypokalemia, no other significant electrolyte derangements, normal renal function  Basic metabolic panel(!) [KF]  6553 Negative Covid test  POC SARS Coronavirus 2 Ag-ED - Nasal Swab (BD Veritor Kit) [KF]  1800 Negative strep test  Group A Strep by PCR [KF]  1829 CT shows significant mucosal and submucosal inflammatory and infectious disease with significant narrowing of nasal passages  CT Maxillofacial W Contrast [KF]  1845 Case discussed with Dr. Constance Holster with ENT, reviewed CT report, he recommends starting patient on clindamycin and steroids, patient should be seen in the office this week for potential debridement.   [KF]  1900 Discussed ENT recommendations and will lab results with the patient.  She is still experiencing some discomfort although reports mild improvement.  Patient states that she has had some degree of chronic pain but worsened over the past few days.  Will treat with Percocet and start patient on clindamycin and steroids.  Patient will be discharged home with close ENT follow-up.   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  She has bloody purulent nasal drainage but no epistaxis, CT scan shows significant mucosal  inflammation and narrowing of the nasal passages.  On arrival she is tachycardic but patient is crying and very upset and uncomfortable.  After her pain was treated tachycardia resolved she has no fevers or other signs of sepsis and is overall well-appearing.  Labs overall reassuring.  After discussing with ENT will treat with antibiotics and steroids and have patient follow-up closely in the office.  Patient also prescribed medication for pain management.  Discharged home in good condition.  Final Clinical Impression(s) / ED Diagnoses Final diagnoses:  Acute recurrent sinusitis, unspecified location  Granulomatosis with polyangiitis, unspecified whether renal involvement ([place])    Rx / DC Orders ED Discharge Orders         Ordered    clindamycin (CLEOCIN) 300 MG capsule  3 times daily     01/29/19 2030    methylPREDNISolone (MEDROL DOSEPAK) 4 MG TBPK tablet     01/29/19 2030    oxyCODONE-acetaminophen (PERCOCET) 5-325 MG tablet  Every 6 hours PRN     01/29/19 2030           Jacqlyn Larsen, PA-C 01/29/19 2223    Tegeler, Gwenyth Allegra, MD 01/30/19 2116

## 2019-01-29 NOTE — Discharge Instructions (Signed)
Please take antibiotics and steroid Dosepak as directed, continue taking your regular daily prednisone as well.  Use Percocet as needed for pain, do not drive while taking this medication as it can cause drowsiness.  Please call to schedule follow-up appointment with Dr. Jearld Fenton with ENT this week.  Return to the ED if you develop fevers, worsening pain or other new or concerning symptoms.

## 2019-02-25 ENCOUNTER — Other Ambulatory Visit: Payer: Self-pay | Admitting: Otolaryngology

## 2019-05-05 ENCOUNTER — Ambulatory Visit (INDEPENDENT_AMBULATORY_CARE_PROVIDER_SITE_OTHER): Payer: Medicaid Other | Admitting: Primary Care

## 2019-05-09 ENCOUNTER — Ambulatory Visit (INDEPENDENT_AMBULATORY_CARE_PROVIDER_SITE_OTHER): Payer: Medicaid Other | Admitting: Primary Care

## 2019-07-20 ENCOUNTER — Ambulatory Visit (INDEPENDENT_AMBULATORY_CARE_PROVIDER_SITE_OTHER): Payer: Medicaid Other | Admitting: Primary Care

## 2019-07-22 ENCOUNTER — Ambulatory Visit (INDEPENDENT_AMBULATORY_CARE_PROVIDER_SITE_OTHER): Payer: Medicaid Other | Admitting: Primary Care

## 2019-11-15 ENCOUNTER — Emergency Department (HOSPITAL_COMMUNITY)
Admission: EM | Admit: 2019-11-15 | Discharge: 2019-11-16 | Disposition: A | Discharge status: Home / Self Care | Payer: Medicaid Other | Attending: Emergency Medicine | Admitting: Emergency Medicine

## 2019-11-15 ENCOUNTER — Other Ambulatory Visit: Payer: Self-pay

## 2019-11-15 DIAGNOSIS — Z5321 Procedure and treatment not carried out due to patient leaving prior to being seen by health care provider: Secondary | ICD-10-CM | POA: Insufficient documentation

## 2019-11-15 DIAGNOSIS — R0602 Shortness of breath: Secondary | ICD-10-CM | POA: Diagnosis present

## 2019-11-16 ENCOUNTER — Encounter (HOSPITAL_COMMUNITY): Payer: Self-pay | Admitting: Emergency Medicine

## 2019-11-16 ENCOUNTER — Emergency Department (HOSPITAL_COMMUNITY): Payer: Medicaid Other

## 2019-11-16 ENCOUNTER — Other Ambulatory Visit: Payer: Self-pay

## 2019-11-16 LAB — COMPREHENSIVE METABOLIC PANEL
ALT: 17 U/L (ref 0–44)
AST: 24 U/L (ref 15–41)
Albumin: 4.5 g/dL (ref 3.5–5.0)
Alkaline Phosphatase: 82 U/L (ref 38–126)
Anion gap: 14 (ref 5–15)
BUN: 8 mg/dL (ref 6–20)
CO2: 23 mmol/L (ref 22–32)
Calcium: 9.6 mg/dL (ref 8.9–10.3)
Chloride: 99 mmol/L (ref 98–111)
Creatinine, Ser: 0.98 mg/dL (ref 0.44–1.00)
GFR, Estimated: >60 mL/min (ref >60)
Glucose, Bld: 156 mg/dL — ABNORMAL HIGH (ref 70–99)
Potassium: 3.5 mmol/L (ref 3.5–5.1)
Sodium: 136 mmol/L (ref 135–145)
Total Bilirubin: 0.3 mg/dL (ref 0.3–1.2)
Total Protein: 7.8 g/dL (ref 6.5–8.1)

## 2019-11-16 LAB — CBC WITH DIFFERENTIAL/PLATELET
Abs Immature Granulocytes: 0.04 10*3/uL (ref 0.00–0.07)
Basophils Absolute: 0 10*3/uL (ref 0.0–0.1)
Basophils Relative: 0 %
Eosinophils Absolute: 0.2 10*3/uL (ref 0.0–0.5)
Eosinophils Relative: 2 %
HCT: 35.2 % — ABNORMAL LOW (ref 36.0–46.0)
Hemoglobin: 11.3 g/dL — ABNORMAL LOW (ref 12.0–15.0)
Immature Granulocytes: 1 %
Lymphocytes Relative: 40 %
Lymphs Abs: 3.4 10*3/uL (ref 0.7–4.0)
MCH: 25.1 pg — ABNORMAL LOW (ref 26.0–34.0)
MCHC: 32.1 g/dL (ref 30.0–36.0)
MCV: 78.2 fL — ABNORMAL LOW (ref 80.0–100.0)
Monocytes Absolute: 1 10*3/uL (ref 0.1–1.0)
Monocytes Relative: 12 %
Neutro Abs: 3.9 10*3/uL (ref 1.7–7.7)
Neutrophils Relative %: 45 %
Platelets: 391 10*3/uL (ref 150–400)
RBC: 4.5 MIL/uL (ref 3.87–5.11)
RDW: 16.6 % — ABNORMAL HIGH (ref 11.5–15.5)
WBC: 8.5 10*3/uL (ref 4.0–10.5)
nRBC: 0 % (ref 0.0–0.2)

## 2019-11-16 LAB — I-STAT BETA HCG BLOOD, ED (MC, WL, AP ONLY): I-stat hCG, quantitative: <5 m[IU]/mL (ref <5)

## 2019-11-16 NOTE — ED Triage Notes (Signed)
Patient reports SOB this evening , no cough or fever , patient also stated she feels like her tongue and throat is swelling , denies fever or chills , airway is intact . O2 sat= 99% room air at triage .

## 2019-11-16 NOTE — ED Notes (Signed)
Pt called 3x no response  

## 2019-11-16 NOTE — ED Notes (Signed)
Pt called 1x no response

## 2019-12-24 ENCOUNTER — Other Ambulatory Visit: Payer: Self-pay

## 2019-12-25 ENCOUNTER — Emergency Department (HOSPITAL_COMMUNITY): Payer: Medicaid Other

## 2019-12-25 ENCOUNTER — Emergency Department (HOSPITAL_COMMUNITY)
Admission: EM | Admit: 2019-12-25 | Discharge: 2019-12-25 | Disposition: A | Discharge status: Home / Self Care | Payer: Medicaid Other | Attending: Emergency Medicine | Admitting: Emergency Medicine

## 2019-12-25 ENCOUNTER — Other Ambulatory Visit: Payer: Self-pay

## 2019-12-25 ENCOUNTER — Encounter (HOSPITAL_COMMUNITY): Payer: Self-pay | Admitting: Emergency Medicine

## 2019-12-25 DIAGNOSIS — R0789 Other chest pain: Secondary | ICD-10-CM | POA: Insufficient documentation

## 2019-12-25 DIAGNOSIS — Y636 Underdosing and nonadministration of necessary drug, medicament or biological substance: Secondary | ICD-10-CM | POA: Insufficient documentation

## 2019-12-25 DIAGNOSIS — R202 Paresthesia of skin: Secondary | ICD-10-CM | POA: Diagnosis not present

## 2019-12-25 DIAGNOSIS — T887XXA Unspecified adverse effect of drug or medicament, initial encounter: Secondary | ICD-10-CM | POA: Diagnosis not present

## 2019-12-25 DIAGNOSIS — R0981 Nasal congestion: Secondary | ICD-10-CM | POA: Diagnosis not present

## 2019-12-25 DIAGNOSIS — T405X5A Adverse effect of cocaine, initial encounter: Secondary | ICD-10-CM | POA: Diagnosis not present

## 2019-12-25 DIAGNOSIS — F419 Anxiety disorder, unspecified: Secondary | ICD-10-CM | POA: Diagnosis not present

## 2019-12-25 DIAGNOSIS — I10 Essential (primary) hypertension: Secondary | ICD-10-CM | POA: Diagnosis not present

## 2019-12-25 DIAGNOSIS — R000 Tachycardia, unspecified: Secondary | ICD-10-CM | POA: Insufficient documentation

## 2019-12-25 DIAGNOSIS — R1310 Dysphagia, unspecified: Secondary | ICD-10-CM | POA: Diagnosis present

## 2019-12-25 DIAGNOSIS — F17210 Nicotine dependence, cigarettes, uncomplicated: Secondary | ICD-10-CM | POA: Insufficient documentation

## 2019-12-25 LAB — TROPONIN I (HIGH SENSITIVITY): Troponin I (High Sensitivity): 5 ng/L (ref <18)

## 2019-12-25 LAB — I-STAT BETA HCG BLOOD, ED (MC, WL, AP ONLY): I-stat hCG, quantitative: <5 m[IU]/mL (ref <5)

## 2019-12-25 MED ORDER — LORAZEPAM 1 MG PO TABS
1.0000 mg | ORAL_TABLET | Freq: Once | ORAL | Status: AC
  Administered 2019-12-25: 04:00:00 1 mg via ORAL
  Filled 2019-12-25: qty 1

## 2019-12-25 NOTE — ED Notes (Signed)
Upon entering the Examination Room to assess the pt regarding their visit and place them on monitoring, the pt began to voice concerns regarding the professionalism of the staff in the waiting room and triage area. This RN ensured that this RN would return and address this patients concerns. Upon leaving this RN began to converse with another staff member regarding a different patient in a confidential manner that would not violate HIPPA guidelines. The patient then yelled and stated "I can hear you talking about me!". This RN made the pt aware that several times that me and the staff member were conversing about a different patient and that any of the professional remarks made regarding the other patient were not concerning her and/or regarding her at all. This RN left the room but will continue to monitor.

## 2019-12-25 NOTE — ED Provider Notes (Signed)
[facility] EMERGENCY DEPARTMENT Provider Note   CSN: [id number] Arrival date & time: 12/25/19  0007     History Chief Complaint  Patient presents with  . Ingestion    Sandra Lee is a 34 y.o. female.  34 year old female presents to the emergency department for evaluation of dysphagia.  She reports snorting cocaine around 1400.  She started to feel a tingling sensation in her throat post ingestion.  Felt as though her throat was swelling making it difficult for her to swallow and breathe.  She has become very anxious, tearful.  Notes persistent nasal congestion and rhinorrhea.  Is not having any chest pain, but does report some tightness in her chest.  Does have a history of anxiety and has not taken anything for this today.  Denies syncope, vomiting, recent fevers.  She has used cocaine in the past.  Believes her dose today was laced with something.       Past Medical History:  Diagnosis Date  . Abnormal Pap smear   . Depression    hx of depression - not taking medication  . Heart murmur 34 yrs old   not sure if it is still present.  Marland Kitchen PIH (pregnancy induced hypertension), antepartum 11/19/2010    Patient Active Problem List   Diagnosis Date Noted  . PIH (pregnancy induced hypertension) 11/27/2010    History reviewed. No pertinent surgical history.   OB History    Gravida  1   Para  1   Term  0   Preterm  1   AB  0   Living  1     SAB  0   IAB  0   Ectopic  0   Multiple  0   Live Births  1           Family History  Problem Relation Age of Onset  . Depression Mother   . Early death Father        HIV  . Arthritis Sister   . Diabetes Maternal Aunt   . Early death Maternal Aunt   . Alcohol abuse Maternal Uncle   . Diabetes Maternal Uncle   . Diabetes Paternal Aunt   . Diabetes Paternal Uncle   . Arthritis Maternal Grandmother   . Diabetes Maternal Grandmother   . Heart disease Maternal Grandmother   . Stroke Maternal  Grandmother   . Diabetes Maternal Grandfather     Social History   Tobacco Use  . Smoking status: Current Every Day Smoker    Packs/day: 0.50    Types: Cigarettes  . Smokeless tobacco: Never Used  Vaping Use  . Vaping Use: Never used  Substance Use Topics  . Alcohol use: No  . Drug use: Yes    Types: Cocaine    Home Medications Prior to Admission medications   Medication Sig Start Date End Date Taking? Authorizing Provider  etonogestrel (NEXPLANON) 68 MG IMPL implant 68 mg by Subdermal route once.    Yes [provider]  folic acid (FOLVITE) 1 MG tablet Take 1 mg by mouth daily. 01/19/19  Yes [provider]  hydrochlorothiazide (HYDRODIURIL) 25 MG tablet Take 25 mg by mouth daily. 01/19/19  Yes [provider]  methotrexate (RHEUMATREX) 2.5 MG tablet Take 20 mg by mouth once a week. Wednesdays 01/19/19  Yes [provider]  Multiple Vitamin (MULTIVITAMIN ADULT PO) Take 1 tablet by mouth daily.   Yes [provider]  predniSONE (DELTASONE) 5 MG tablet  Take 5 mg by mouth daily. 01/19/19  Yes [provider]    Allergies    Penicillins  Review of Systems   Review of Systems  Ten systems reviewed and are negative for acute change, except as noted in the HPI.    Physical Exam Updated Vital Signs BP (!) 143/93   Pulse 90   Temp 98.8 F (37.1 C) (Oral)   Resp (!) 25   Ht 5\' 5"  (1.651 m)   Wt 63.5 kg   LMP 11/18/2019 (Approximate)   SpO2 98%   BMI 23.30 kg/m   Physical Exam Vitals and nursing note reviewed.  Constitutional:      General: She is not in acute distress.    Appearance: She is well-developed and well-nourished. She is not diaphoretic.     Comments: Patient in NAD  HENT:     Head: Normocephalic and atraumatic.  Eyes:     General: No scleral icterus.    Extraocular Movements: EOM normal.     Conjunctiva/sclera: Conjunctivae normal.  Cardiovascular:     Rate and Rhythm: Regular rhythm. Tachycardia  present.     Pulses: Normal pulses.  Pulmonary:     Effort: Pulmonary effort is normal. No respiratory distress.     Breath sounds: No stridor. No wheezing, rhonchi or rales.     Comments: Lungs CTAB Musculoskeletal:        General: Normal range of motion.     Cervical back: Normal range of motion.  Skin:    General: Skin is warm and dry.     Coloration: Skin is not pale.     Findings: No erythema or rash.  Neurological:     Mental Status: She is alert and oriented to person, place, and time.     Coordination: Coordination normal.  Psychiatric:        Mood and Affect: Mood is anxious. Affect is tearful.        Behavior: Behavior is agitated and withdrawn.     Comments: Crying uncontrollably.      ED Results / Procedures / Treatments   Labs (all labs ordered are listed, but only abnormal results are displayed) Labs Reviewed  I-STAT BETA HCG BLOOD, ED ([facility], [facility], [facility] ONLY)  TROPONIN I (HIGH SENSITIVITY)    EKG EKG Interpretation  Date/Time:  Sunday December 25 2019 05:06:15 EST Ventricular Rate:  92 PR Interval:    QRS Duration: 82 QT Interval:  345 QTC Calculation: 427 R Axis:   86 Text Interpretation: Sinus rhythm Right atrial enlargement Borderline repolarization abnormality Baseline wander in lead(s) III V4 V5 V6 No significant change since last tracing Confirmed by 1970 [phone]) on 12/25/2019 5:08:37 AM Also confirmed by Ward, 14/12/2019 [phone]), editor ([phone])  on 12/25/2019 3:04:07 PM   Radiology DG Chest Port 1 View  Result Date: 12/25/2019 CLINICAL DATA:  Pt st's she snorted cocaine earlier today (14 hours ago) St's now she feels jittery. Also st's after she snorted it she felt like her throat was swelling. Pt appears very anxious at this time and tearful. Pt st's she thinks.*comment was truncated*SOB EXAM: PORTABLE CHEST 1 VIEW COMPARISON:  None. FINDINGS: Normal mediastinum and cardiac silhouette. Normal pulmonary vasculature. No evidence  of effusion, infiltrate, or pneumothorax. No acute bony abnormality. IMPRESSION: No acute cardiopulmonary process. Electronically Signed   By: 14/12/2019 M.D.   On: 12/25/2019 05:50    Procedures Procedures (including critical care time)  Medications Ordered in ED Medications  LORazepam (ATIVAN)  tablet 1 mg (1 mg Oral Given 12/25/19 0401)    ED Course  I have reviewed the triage vital signs and the nursing notes.  Pertinent labs & imaging results that were available during my care of the patient were reviewed by me and considered in my medical decision making (see chart for details).    MDM Rules/Calculators/A&P                          34 year old female presents to the emergency department for evaluation complaining of sensation of throat swelling after cocaine use.  She is visibly anxious, tearful.  Initially tachycardic, though this has spontaneously improved.  Given a tablet of Ativan in the ED for anxiety.  She has no signs of angioedema on exam.  Is tolerating secretions without lip or tongue swelling.  No tripoding, stridor.  Lungs clear to auscultation bilaterally.  She has never been hypoxic.  Chest x-ray without acute cardiopulmonary abnormality.  Troponin is also reassuring, normal.  Her EKG is largely unchanged from prior tracings.  Given that symptoms have been stable for greater than 12 hours without clinical worsening or decompensation, feel the patient can continue follow-up with her primary care doctor.  Have encouraged her to refrain from continued drug use.  Return precautions provided. Patient discharged in stable condition with no unaddressed concerns.   Final Clinical Impression(s) / ED Diagnoses Final diagnoses:  Cocaine adverse reaction, initial encounter    Rx / DC Orders ED Discharge Orders    None       Antony Madura, PA-C 12/25/19 2356    Ward, Layla Maw, DO 12/29/19 0101

## 2019-12-25 NOTE — ED Triage Notes (Signed)
Pt st's she snorted cocaine earlier today (14 hours ago)  St's now she feels jittery.   Also st's after she snorted it she felt like her throat was swelling.  Pt appears very anxious at this time and tearful.   Pt st's she thinks it was laced with something

## 2020-05-24 ENCOUNTER — Ambulatory Visit: Payer: Medicaid Other | Admitting: Obstetrics

## 2020-06-13 ENCOUNTER — Other Ambulatory Visit: Payer: Self-pay

## 2020-06-13 ENCOUNTER — Other Ambulatory Visit (HOSPITAL_COMMUNITY)
Admission: RE | Admit: 2020-06-13 | Discharge: 2020-06-13 | Disposition: A | Discharge status: Home / Self Care | Payer: Medicaid Other | Source: Ambulatory Visit | Attending: Obstetrics | Admitting: Obstetrics

## 2020-06-13 ENCOUNTER — Encounter: Payer: Self-pay | Admitting: Obstetrics

## 2020-06-13 ENCOUNTER — Ambulatory Visit (INDEPENDENT_AMBULATORY_CARE_PROVIDER_SITE_OTHER): Payer: Medicaid Other | Admitting: Obstetrics

## 2020-06-13 VITALS — BP 132/82 | HR 80 | Wt 145.0 lb

## 2020-06-13 DIAGNOSIS — N898 Other specified noninflammatory disorders of vagina: Secondary | ICD-10-CM | POA: Diagnosis not present

## 2020-06-13 DIAGNOSIS — Z01419 Encounter for gynecological examination (general) (routine) without abnormal findings: Secondary | ICD-10-CM | POA: Insufficient documentation

## 2020-06-13 DIAGNOSIS — Z01812 Encounter for preprocedural laboratory examination: Secondary | ICD-10-CM | POA: Diagnosis not present

## 2020-06-13 DIAGNOSIS — Z3046 Encounter for surveillance of implantable subdermal contraceptive: Secondary | ICD-10-CM

## 2020-06-13 DIAGNOSIS — Z30017 Encounter for initial prescription of implantable subdermal contraceptive: Secondary | ICD-10-CM | POA: Diagnosis not present

## 2020-06-13 DIAGNOSIS — Z113 Encounter for screening for infections with a predominantly sexual mode of transmission: Secondary | ICD-10-CM | POA: Diagnosis not present

## 2020-06-13 LAB — POCT URINE PREGNANCY: Preg Test, Ur: NEGATIVE

## 2020-06-13 MED ORDER — ETONOGESTREL 68 MG ~~LOC~~ IMPL
68.0000 mg | DRUG_IMPLANT | Freq: Once | SUBCUTANEOUS | Status: AC
  Administered 2020-06-13: 68 mg via SUBCUTANEOUS

## 2020-06-13 NOTE — Addendum Note (Signed)
Addended by: Dalphine Handing on: 06/13/2020 03:12 PM   Modules accepted: Orders

## 2020-06-13 NOTE — Progress Notes (Signed)
Subjective:        Sandra Lee is a 35 y.o. female here for a routine exam.  Current complaints: Vaginal discharge.    Personal health questionnaire:  Is patient Ashkenazi Jewish, have a family history of breast and/or ovarian cancer: no Is there a family history of uterine cancer diagnosed at age < 85, gastrointestinal cancer, urinary tract cancer, family member who is a Personnel officer syndrome-associated carrier: no Is the patient overweight and hypertensive, family history of diabetes, personal history of gestational diabetes, preeclampsia or PCOS: no Is patient over 48, have PCOS,  family history of premature CHD under age 32, diabetes, smoke, have hypertension or peripheral artery disease:  no At any time, has a partner hit, kicked or otherwise hurt or frightened you?: no Over the past 2 weeks, have you felt down, depressed or hopeless?: no Over the past 2 weeks, have you felt little interest or pleasure in doing things?:no   Gynecologic History Patient's last menstrual period was 05/30/2020. Contraception: Nexplanon Last Pap: unsure. Results were: normal Last mammogram: n/a. Results were: n/a  Obstetric History OB History  Gravida Para Term Preterm AB Living  1 1 0 1 0 1  SAB IAB Ectopic Multiple Live Births  0 0 0 0 1    # Outcome Date GA Lbr Len/2nd Weight Sex Delivery Anes PTL Lv  1 Preterm 11/22/10 [redacted]w[redacted]d 04:25 / 00:50 4 lb 12.9 oz (2.18 kg) M Vag-Spont EPI  LIV     Birth Comments: no dysmorphic features; voided under radiant warmer    Past Medical History:  Diagnosis Date  . Abnormal Pap smear   . Depression    hx of depression - not taking medication  . Heart murmur 35 yrs old   not sure if it is still present.  Marland Kitchen PIH (pregnancy induced hypertension), antepartum 11/19/2010    History reviewed. No pertinent surgical history.   Current Outpatient Medications:  .  CALCIUM PO, Take by mouth., Disp: , Rfl:  .  etonogestrel (NEXPLANON) 68 MG IMPL implant, 68 mg by  Subdermal route once. , Disp: , Rfl:  .  folic acid (FOLVITE) 1 MG tablet, Take 1 mg by mouth daily., Disp: , Rfl:  .  hydrochlorothiazide (HYDRODIURIL) 25 MG tablet, Take 25 mg by mouth daily., Disp: , Rfl:  .  methotrexate (RHEUMATREX) 2.5 MG tablet, Take 20 mg by mouth once a week. Wednesdays, Disp: , Rfl:  .  Multiple Vitamin (MULTIVITAMIN ADULT PO), Take 1 tablet by mouth daily., Disp: , Rfl:  .  predniSONE (DELTASONE) 5 MG tablet, Take 5 mg by mouth daily., Disp: , Rfl:  Allergies  Allergen Reactions  . Penicillins Other (See Comments)    Childhood reaction patient unsure of allergic response    Social History   Tobacco Use  . Smoking status: Current Every Day Smoker    Packs/day: 0.50    Types: Cigarettes  . Smokeless tobacco: Never Used  Substance Use Topics  . Alcohol use: No    Family History  Problem Relation Age of Onset  . Depression Mother   . Early death Father        HIV  . Arthritis Sister   . Diabetes Maternal Aunt   . Early death Maternal Aunt   . Alcohol abuse Maternal Uncle   . Diabetes Maternal Uncle   . Diabetes Paternal Aunt   . Diabetes Paternal Uncle   . Arthritis Maternal Grandmother   . Diabetes Maternal Grandmother   .  Heart disease Maternal Grandmother   . Stroke Maternal Grandmother   . Diabetes Maternal Grandfather       Review of Systems  Constitutional: negative for fatigue and weight loss Respiratory: negative for cough and wheezing Cardiovascular: negative for chest pain, fatigue and palpitations Gastrointestinal: negative for abdominal pain and change in bowel habits Musculoskeletal:negative for myalgias Neurological: negative for gait problems and tremors Behavioral/Psych: negative for abusive relationship, depression Endocrine: negative for temperature intolerance    Genitourinary: positive for vaginal discharge.  negative for abnormal menstrual periods, genital lesions, hot flashes, sexual problems Integument/breast: negative  for breast lump, breast tenderness, nipple discharge and skin lesion(s)    Objective:       BP 132/82   Pulse 80   Wt 145 lb (65.8 kg)   LMP 05/30/2020   BMI 24.13 kg/m  General:   alert and no distress  Skin:   no rash or abnormalities  Lungs:   clear to auscultation bilaterally  Heart:   regular rate and rhythm, S1, S2 normal, no murmur, click, rub or gallop  Breasts:   normal without suspicious masses, skin or nipple changes or axillary nodes  Abdomen:  normal findings: no organomegaly, soft, non-tender and no hernia  Pelvis:  External genitalia: normal general appearance Urinary system: urethral meatus normal and bladder without fullness, nontender Vaginal: normal without tenderness, induration or masses Cervix: normal appearance Adnexa: normal bimanual exam Uterus: anteverted and non-tender, normal size   Lab Review Urine pregnancy test Labs reviewed yes Radiologic studies reviewed no  I have spent a total of 30 minutes of face-to-face time, excluding clinical staff time, reviewing notes and preparing to see patient, ordering tests and/or medications, and counseling the patient.  Assessment:     1. Encounter for gynecological examination with Papanicolaou smear of cervix Rx: - Cytology - PAP  2. Vaginal discharge Rx: - Cervicovaginal ancillary only  3. Screening for STD (sexually transmitted disease) Rx: - Hepatitis B surface antigen - Hepatitis C antibody - HIV Antibody (routine testing w rflx) - RPR  4. Encounter for removal and reinsertion of Nexplanon Rx: - POCT urine pregnancy     Plan:    Education reviewed: calcium supplements, depression evaluation, low fat, low cholesterol diet, safe sex/STD prevention, self breast exams, smoking cessation and weight bearing exercise. Contraception: Nexplanon. Follow up in: 2 weeks.    Orders Placed This Encounter  Procedures  . Hepatitis B surface antigen  . Hepatitis C antibody  . HIV Antibody (routine  testing w rflx)  . RPR  . POCT urine pregnancy    Brock Bad, MD 06/13/2020 12:02 PM     Nexplanon Procedure Note    PROCEDURE: Nexplanon Removal and Reinsertion Performing Provider: Brock Bad MD  Patient education prior to procedure, explained risk, benefits of Nexplanon, reviewed alternative options. Patient reported understanding. Gave consent to continue with procedure.  Patient had NEXPLANON inserted in 2019. Desires removal today w/ reinsertion  PROCEDURE:  Pregnancy Text :  not indicated Site (check):      left arm         Sterile Preparation:   Betadinex3 Lot #:  [id number] Expiration Date: 3491PHX50    The patient's left arm was palpated and the implant device located. The area was prepped with Betadinex3. The distal end of the device was palpated and 1.5 cc of 1% lidocaine without epinephrine was injected. A 1.5 mm incision was made. Any fibrotic tissue was carefully dissected away using blunt and/or sharp dissection.  The device was removed in an intact manner.   Insertion site was the same as the removal site. Nexplanon  was inserted subcutaneously.Needle was removed from the insertion site. Nexplanon capsule was palpated by provider and patient to assure satisfactory placement. Dressing applied.  Followup: The patient tolerated the procedure well without complications.  Standard post-procedure care is explained and return precautions are given.  Brock Bad, MD 06/13/2020 12:08 PM

## 2020-06-13 NOTE — Progress Notes (Signed)
NGYN pt presents for annual and Nexplanon removal and reinsertion.  Nexplanon expired March 2022 Unsure last pap per pt  UPT negative

## 2020-06-14 ENCOUNTER — Other Ambulatory Visit: Payer: Self-pay | Admitting: Obstetrics

## 2020-06-14 DIAGNOSIS — N760 Acute vaginitis: Secondary | ICD-10-CM

## 2020-06-14 DIAGNOSIS — B9689 Other specified bacterial agents as the cause of diseases classified elsewhere: Secondary | ICD-10-CM

## 2020-06-14 LAB — RPR: RPR Ser Ql: NONREACTIVE

## 2020-06-14 LAB — CERVICOVAGINAL ANCILLARY ONLY
Bacterial Vaginitis (gardnerella): POSITIVE — AB
Candida Glabrata: NEGATIVE
Candida Vaginitis: NEGATIVE
Chlamydia: NEGATIVE
Comment: NEGATIVE
Comment: NEGATIVE
Comment: NEGATIVE
Comment: NEGATIVE
Comment: NEGATIVE
Comment: NORMAL
Neisseria Gonorrhea: NEGATIVE
Trichomonas: NEGATIVE

## 2020-06-14 LAB — HIV ANTIBODY (ROUTINE TESTING W REFLEX): HIV Screen 4th Generation wRfx: NONREACTIVE

## 2020-06-14 LAB — HEPATITIS B SURFACE ANTIGEN: Hepatitis B Surface Ag: NEGATIVE

## 2020-06-14 LAB — HEPATITIS C ANTIBODY: Hep C Virus Ab: <0.1 s/co ratio (ref 0.0–0.9)

## 2020-06-14 MED ORDER — METRONIDAZOLE 500 MG PO TABS: 500.0000 mg | ORAL_TABLET | Freq: Two times a day (BID) | ORAL | 2 refills | Status: DC

## 2020-06-15 LAB — CYTOLOGY - PAP
Comment: NEGATIVE
Diagnosis: NEGATIVE
High risk HPV: NEGATIVE

## 2020-06-27 ENCOUNTER — Ambulatory Visit: Payer: Medicaid Other | Admitting: Obstetrics

## 2020-07-06 ENCOUNTER — Ambulatory Visit (INDEPENDENT_AMBULATORY_CARE_PROVIDER_SITE_OTHER): Payer: Medicaid Other | Admitting: Obstetrics

## 2020-07-06 ENCOUNTER — Other Ambulatory Visit: Payer: Self-pay

## 2020-07-06 ENCOUNTER — Encounter: Payer: Self-pay | Admitting: Obstetrics

## 2020-07-06 VITALS — BP 139/93 | HR 83 | Ht 65.0 in | Wt 141.8 lb

## 2020-07-06 DIAGNOSIS — Z3046 Encounter for surveillance of implantable subdermal contraceptive: Secondary | ICD-10-CM

## 2020-07-06 NOTE — Progress Notes (Signed)
Subjective:    Sandra Lee is a 35 y.o. female who presents for contraception counseling. The patient has no complaints today. The patient is sexually active. Pertinent past medical history: current smoker.  The information documented in the HPI was reviewed and verified.  Menstrual History: OB History     Gravida  1   Para  1   Term  0   Preterm  1   AB  0   Living  1      SAB  0   IAB  0   Ectopic  0   Multiple  0   Live Births  1            No LMP recorded. Patient has had an implant.   Patient Active Problem List   Diagnosis Date Noted   PIH (pregnancy induced hypertension) 11/27/2010   Past Medical History:  Diagnosis Date   Abnormal Pap smear    Depression    hx of depression - not taking medication   Heart murmur 35 yrs old   not sure if it is still present.   PIH (pregnancy induced hypertension), antepartum 11/19/2010    History reviewed. No pertinent surgical history.   Current Outpatient Medications:    CALCIUM PO, Take by mouth., Disp: , Rfl:    etonogestrel (NEXPLANON) 68 MG IMPL implant, 68 mg by Subdermal route once. , Disp: , Rfl:    folic acid (FOLVITE) 1 MG tablet, Take 1 mg by mouth daily., Disp: , Rfl:    hydrochlorothiazide (HYDRODIURIL) 25 MG tablet, Take 25 mg by mouth daily., Disp: , Rfl:    methotrexate (RHEUMATREX) 2.5 MG tablet, Take 20 mg by mouth once a week. Wednesdays, Disp: , Rfl:    metroNIDAZOLE (FLAGYL) 500 MG tablet, Take 1 tablet (500 mg total) by mouth 2 (two) times daily., Disp: 14 tablet, Rfl: 2   Multiple Vitamin (MULTIVITAMIN ADULT PO), Take 1 tablet by mouth daily., Disp: , Rfl:    predniSONE (DELTASONE) 5 MG tablet, Take 5 mg by mouth daily., Disp: , Rfl:  Allergies  Allergen Reactions   Penicillins Other (See Comments)    Childhood reaction patient unsure of allergic response    Social History   Tobacco Use   Smoking status: Every Day    Packs/day: 0.50    Pack years: 0.00    Types: Cigarettes    Smokeless tobacco: Never  Substance Use Topics   Alcohol use: No    Family History  Problem Relation Age of Onset   Depression Mother    Early death Father        HIV   Arthritis Sister    Diabetes Maternal Aunt    Early death Maternal Aunt    Alcohol abuse Maternal Uncle    Diabetes Maternal Uncle    Diabetes Paternal Aunt    Diabetes Paternal Uncle    Arthritis Maternal Grandmother    Diabetes Maternal Grandmother    Heart disease Maternal Grandmother    Stroke Maternal Grandmother    Diabetes Maternal Grandfather        Review of Systems Constitutional: negative for weight loss Genitourinary:negative for abnormal menstrual periods and vaginal discharge   Objective:   BP (!) 139/93   Pulse 83   Ht 5\' 5"  (1.651 m)   Wt 141 lb 12.8 oz (64.3 kg)   BMI 23.60 kg/m    General:   Alert and no distress  Skin:   no rash or abnormalities  Lungs:   clear to auscultation bilaterally  Heart:   regular rate and rhythm, S1, S2 normal, no murmur, click, rub or gallop              Left Upper Extremity:  Nexplanon site is clean, dry, intact and nontender.  Rod palpated, intact    Lab Review Urine pregnancy test Labs reviewed yes Radiologic studies reviewed no  I have spent a total of 15 minutes of face-to-face time, excluding clinical staff time, reviewing notes and preparing to see patient, ordering tests and/or medications, and counseling the patient.   Assessment:    35 y.o., continuing Nexplanon, no contraindications.   Plan:    All questions answered. Contraception: Nexplanon. Follow up in 1 year.  Brock Bad, MD 07/06/2020 11:37 AM

## 2020-07-06 NOTE — Progress Notes (Signed)
Pt is in the office for nexplanon follow up, inserted on 06-13-20.

## 2020-08-15 ENCOUNTER — Other Ambulatory Visit (HOSPITAL_COMMUNITY)
Admission: EM | Admit: 2020-08-15 | Discharge: 2020-08-17 | Disposition: A | Discharge status: Home / Self Care | Payer: Medicaid Other | Attending: Family | Admitting: Family

## 2020-08-15 ENCOUNTER — Encounter (HOSPITAL_COMMUNITY): Payer: Self-pay | Admitting: Family

## 2020-08-15 ENCOUNTER — Other Ambulatory Visit: Payer: Self-pay

## 2020-08-15 DIAGNOSIS — Z560 Unemployment, unspecified: Secondary | ICD-10-CM | POA: Insufficient documentation

## 2020-08-15 DIAGNOSIS — Z20822 Contact with and (suspected) exposure to covid-19: Secondary | ICD-10-CM | POA: Diagnosis not present

## 2020-08-15 DIAGNOSIS — F1410 Cocaine abuse, uncomplicated: Secondary | ICD-10-CM

## 2020-08-15 DIAGNOSIS — F333 Major depressive disorder, recurrent, severe with psychotic symptoms: Secondary | ICD-10-CM | POA: Insufficient documentation

## 2020-08-15 DIAGNOSIS — G4700 Insomnia, unspecified: Secondary | ICD-10-CM | POA: Insufficient documentation

## 2020-08-15 DIAGNOSIS — Z9151 Personal history of suicidal behavior: Secondary | ICD-10-CM | POA: Insufficient documentation

## 2020-08-15 DIAGNOSIS — Z818 Family history of other mental and behavioral disorders: Secondary | ICD-10-CM | POA: Insufficient documentation

## 2020-08-15 DIAGNOSIS — F1994 Other psychoactive substance use, unspecified with psychoactive substance-induced mood disorder: Secondary | ICD-10-CM | POA: Diagnosis present

## 2020-08-15 DIAGNOSIS — Z79899 Other long term (current) drug therapy: Secondary | ICD-10-CM | POA: Insufficient documentation

## 2020-08-15 DIAGNOSIS — R45851 Suicidal ideations: Secondary | ICD-10-CM | POA: Insufficient documentation

## 2020-08-15 DIAGNOSIS — Z62810 Personal history of physical and sexual abuse in childhood: Secondary | ICD-10-CM | POA: Insufficient documentation

## 2020-08-15 DIAGNOSIS — Z9114 Patient's other noncompliance with medication regimen: Secondary | ICD-10-CM | POA: Insufficient documentation

## 2020-08-15 DIAGNOSIS — F17210 Nicotine dependence, cigarettes, uncomplicated: Secondary | ICD-10-CM | POA: Insufficient documentation

## 2020-08-15 DIAGNOSIS — F1290 Cannabis use, unspecified, uncomplicated: Secondary | ICD-10-CM | POA: Insufficient documentation

## 2020-08-15 LAB — POCT URINE DRUG SCREEN - MANUAL ENTRY (I-SCREEN)
POC Amphetamine UR: NOT DETECTED
POC Buprenorphine (BUP): NOT DETECTED
POC Cocaine UR: POSITIVE — AB
POC Marijuana UR: POSITIVE — AB
POC Methadone UR: NOT DETECTED
POC Methamphetamine UR: NOT DETECTED
POC Morphine: NOT DETECTED
POC Oxazepam (BZO): NOT DETECTED
POC Oxycodone UR: POSITIVE — AB
POC Secobarbital (BAR): NOT DETECTED

## 2020-08-15 LAB — COMPREHENSIVE METABOLIC PANEL
ALT: 16 U/L (ref 0–44)
AST: 23 U/L (ref 15–41)
Albumin: 4.6 g/dL (ref 3.5–5.0)
Alkaline Phosphatase: 77 U/L (ref 38–126)
Anion gap: 12 (ref 5–15)
BUN: 6 mg/dL (ref 6–20)
CO2: 24 mmol/L (ref 22–32)
Calcium: 10.2 mg/dL (ref 8.9–10.3)
Chloride: 100 mmol/L (ref 98–111)
Creatinine, Ser: 0.99 mg/dL (ref 0.44–1.00)
GFR, Estimated: >60 mL/min (ref >60)
Glucose, Bld: 79 mg/dL (ref 70–99)
Potassium: 3.4 mmol/L — ABNORMAL LOW (ref 3.5–5.1)
Sodium: 136 mmol/L (ref 135–145)
Total Bilirubin: 0.5 mg/dL (ref 0.3–1.2)
Total Protein: 7.6 g/dL (ref 6.5–8.1)

## 2020-08-15 LAB — CBC WITH DIFFERENTIAL/PLATELET
Abs Immature Granulocytes: 0.02 10*3/uL (ref 0.00–0.07)
Basophils Absolute: 0 10*3/uL (ref 0.0–0.1)
Basophils Relative: 0 %
Eosinophils Absolute: 0.2 10*3/uL (ref 0.0–0.5)
Eosinophils Relative: 2 %
HCT: 42.6 % (ref 36.0–46.0)
Hemoglobin: 14.6 g/dL (ref 12.0–15.0)
Immature Granulocytes: 0 %
Lymphocytes Relative: 27 %
Lymphs Abs: 2.3 10*3/uL (ref 0.7–4.0)
MCH: 27.1 pg (ref 26.0–34.0)
MCHC: 34.3 g/dL (ref 30.0–36.0)
MCV: 79.2 fL — ABNORMAL LOW (ref 80.0–100.0)
Monocytes Absolute: 0.9 10*3/uL (ref 0.1–1.0)
Monocytes Relative: 10 %
Neutro Abs: 5.3 10*3/uL (ref 1.7–7.7)
Neutrophils Relative %: 61 %
Platelets: 285 10*3/uL (ref 150–400)
RBC: 5.38 MIL/uL — ABNORMAL HIGH (ref 3.87–5.11)
RDW: 14.8 % (ref 11.5–15.5)
WBC: 8.7 10*3/uL (ref 4.0–10.5)
nRBC: 0 % (ref 0.0–0.2)

## 2020-08-15 LAB — LIPID PANEL
Cholesterol: 175 mg/dL (ref 0–200)
HDL: 50 mg/dL (ref >40)
LDL Cholesterol: 106 mg/dL — ABNORMAL HIGH (ref 0–99)
Total CHOL/HDL Ratio: 3.5 RATIO
Triglycerides: 93 mg/dL (ref <150)
VLDL: 19 mg/dL (ref 0–40)

## 2020-08-15 LAB — POC SARS CORONAVIRUS 2 AG -  ED: SARS Coronavirus 2 Ag: NEGATIVE

## 2020-08-15 LAB — ETHANOL: Alcohol, Ethyl (B): <10 mg/dL (ref <10)

## 2020-08-15 LAB — POCT PREGNANCY, URINE: Preg Test, Ur: NEGATIVE

## 2020-08-15 LAB — HEMOGLOBIN A1C
Hgb A1c MFr Bld: 5.9 % — ABNORMAL HIGH (ref 4.8–5.6)
Mean Plasma Glucose: 122.63 mg/dL

## 2020-08-15 LAB — RESP PANEL BY RT-PCR (FLU A&B, COVID) ARPGX2
Influenza A by PCR: NEGATIVE
Influenza B by PCR: NEGATIVE
SARS Coronavirus 2 by RT PCR: NEGATIVE

## 2020-08-15 LAB — TSH: TSH: 1.093 u[IU]/mL (ref 0.350–4.500)

## 2020-08-15 LAB — MAGNESIUM: Magnesium: 2.2 mg/dL (ref 1.7–2.4)

## 2020-08-15 MED ORDER — HYDROCHLOROTHIAZIDE 25 MG PO TABS
25.0000 mg | ORAL_TABLET | Freq: Every day | ORAL | Status: DC
  Administered 2020-08-15 – 2020-08-16 (×2): 25 mg via ORAL
  Filled 2020-08-15 (×3): qty 1

## 2020-08-15 MED ORDER — ALUM & MAG HYDROXIDE-SIMETH 200-200-20 MG/5ML PO SUSP: 30.0000 mL | ORAL | Status: DC | PRN

## 2020-08-15 MED ORDER — CALCIUM CARBONATE 1250 (500 CA) MG PO TABS
500.0000 mg | ORAL_TABLET | Freq: Every day | ORAL | Status: DC
  Administered 2020-08-16: 500 mg via ORAL
  Filled 2020-08-15 (×2): qty 1

## 2020-08-15 MED ORDER — HYDROXYZINE HCL 25 MG PO TABS
25.0000 mg | ORAL_TABLET | Freq: Three times a day (TID) | ORAL | Status: DC | PRN
  Administered 2020-08-16: 25 mg via ORAL
  Filled 2020-08-15 (×2): qty 1

## 2020-08-15 MED ORDER — TRAZODONE HCL 50 MG PO TABS: 50.0000 mg | ORAL_TABLET | Freq: Every evening | ORAL | Status: DC | PRN

## 2020-08-15 MED ORDER — PREDNISONE 10 MG PO TABS
10.0000 mg | ORAL_TABLET | Freq: Every day | ORAL | Status: DC
  Administered 2020-08-16: 10 mg via ORAL
  Filled 2020-08-15 (×2): qty 1

## 2020-08-15 MED ORDER — MAGNESIUM HYDROXIDE 400 MG/5ML PO SUSP: 30.0000 mL | Freq: Every day | ORAL | Status: DC | PRN

## 2020-08-15 MED ORDER — FOLIC ACID 1 MG PO TABS
1.0000 mg | ORAL_TABLET | Freq: Every day | ORAL | Status: DC
  Administered 2020-08-15 – 2020-08-16 (×2): 1 mg via ORAL
  Filled 2020-08-15 (×3): qty 1

## 2020-08-15 MED ORDER — METHOTREXATE SODIUM CHEMO INJECTION 50 MG/2ML
25.0000 mg | INTRAMUSCULAR | Status: DC
Start: 2020-08-16 — End: 2020-08-17
  Administered 2020-08-16: 25 mg via SUBCUTANEOUS

## 2020-08-15 MED ORDER — ACETAMINOPHEN 325 MG PO TABS: 650.0000 mg | ORAL_TABLET | Freq: Four times a day (QID) | ORAL | Status: DC | PRN

## 2020-08-15 NOTE — ED Provider Notes (Addendum)
Behavioral Health Admission H&P [facility] & OBS)  Date: 08/15/20 Patient Name: Sandra Lee MRN: [id number] Chief Complaint:  Chief Complaint  Patient presents with  . Hallucinations    Pt stated that she is experiencing visual hallucination -- people around her; also hearing voices.        Diagnoses:  Final diagnoses:  Substance induced mood disorder (HCC)    HPI: Sandra Lee presents voluntarily to [facility] behavioral health for walk-in assessment.  Patient is assessed face-to-face by nurse practitioner. She is seated in assessment area, no acute distress.  She is alert and oriented, pleasant and cooperative during assessment.  She reports depressed mood with congruent affect, tearful throughout assessment.  She denies suicidal and homicidal ideations.  She endorses history of 1 prior suicide attempt, by intentional cocaine overdose, approximately 15 years ago.  She was hospitalized at [facility] at that time.  She denies history of self-harm.  She contracts verbally for safety with this Clinical research associate.  She has normal speech and behavior.  She endorses visual hallucinations, ongoing times approximately 1 year.  She states "I see things, like shadows and lights."  Patient is able to converse coherently with goal-directed thoughts and no distractibility or preoccupation.  Sh denies paranoia.  Objectively there is no evidence of psychosis/mania or delusional thinking.  She reports depressed mood x1 year.  She endorses feeling hopeless.  She states "I just need some medicine or something, I feel like I am going crazy."  Sandra Lee reports she came to [facility] behavioral health for assessment today at the encouragement of her mother.  She reports chronic use of cocaine and marijuana, last use of both cocaine and marijuana earlier today.  She endorses using "drugs to help with the pain but it is making things worse."  She reports readiness to stop use of cocaine and marijuana.  She has not been  treated for substance use in the past.  Related to substance use she states "I just feel so ashamed."  She reports recent stressors include upcoming move.  She states she has been offered housing for section [employer] but her home will be pad-locked in 2 weeks because the "landlord did not do something right."  She reports feeling overwhelming stress related to this situation as well as substance use.  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  She reports she missed her infusion appointment on last Friday.  She reports she has not been compliant with medications over the last several days related to "drugs in my system."  She does attempt to remain compliant with medications and reports plan to remain compliant with medications moving forward. She reports concern about her overall physical health. She reports current medications include Nexplanon, calcium daily and folic acid daily.  She reports hydrochlorothiazide 25 mg daily.  Last dose of hydrochlorothiazide, calcium and folic acid on Monday of this week.  She has been prescribed prednisone for approximately 3 years, she was recently referred to a rheumatologist at [facility].  On July 11, the rheumatologist at [facility] decreased prednisone from 15 mg daily to current dose 10 mg daily, her last dose of prednisone was on Monday morning of this week.  She reports she would not take prednisone in the afternoon as she would be unable to sleep, would like to resume prednisone 10 mg tomorrow.  She has been prescribed methotrexate for the last 3 years, the rheumatologist  at [facility] updated methotrexate dose from by mouth route to subcutaneous route in July 2022.  Currently prescribed methotrexate 25 mg subcutaneously,  weekly.  She has received methotrexate into the skin only twice, scheduled to receive subcutaneous methotrexate today  however did not receive this medication today related to her mood.  She also receives Rituxan infusion every 6 months, she missed an appointment for this infusion on last Friday, plans to reschedule.   Nylah is not followed by outpatient psychiatry currently, she denies any psychotropic medications.  She reports she has been diagnosed with depression in the past.  She resides in [place] with her 53-year-old son.  She denies access to weapons.  She is currently not employed.  She endorses average sleep and appetite.  Patient offered support and encouragement.  She gives verbal consent to speak with her mother, Sandra Lee.  Patient's mother reports she assists Sandra Lee with weekly injection of methotrexate 25 mg each Wednesday.  She reports patient refused medication today citing her depressed mood.    PHQ 2-9:  Flowsheet Row ED from 08/15/2020 in [facility]  Thoughts that you would be better off dead, or of hurting yourself in some way Several days  PHQ-9 Total Score 15       Flowsheet Row ED from 08/15/2020 in [facility]  C-SSRS RISK CATEGORY Low Risk        Total Time spent with patient: 30 minutes  Musculoskeletal  Strength & Muscle Tone: within normal limits Gait & Station: normal Patient leans: N/A  Psychiatric Specialty Exam  Presentation General Appearance: Appropriate for Environment; Casual  Eye Contact:Good  Speech:Clear and Coherent; Normal Rate  Speech Volume:Normal  Handedness:Right   Mood and Affect  Mood:Depressed  Affect:Depressed; Tearful   Thought Process  Thought Processes:Coherent; Goal Directed  Descriptions of Associations:Intact  Orientation:Full (Time, Place and Person)  Thought Content:WDL; Logical  Diagnosis of Schizophrenia or Schizoaffective disorder in past: No  Duration of Psychotic Symptoms: Greater than six months  Hallucinations:Hallucinations: Visual Description of  Visual Hallucinations: Patient reports "seeing shadows and lights" x1 year  Ideas of Reference:None  Suicidal Thoughts:Suicidal Thoughts: No  Homicidal Thoughts:Homicidal Thoughts: No   Sensorium  Memory:Immediate Good; Recent Good; Remote Good  Judgment:Fair  Insight:Fair   Executive Functions  Concentration:Good  Attention Span:Good  Recall:Good  Fund of Knowledge:Good  Language:Good   Psychomotor Activity  Psychomotor Activity:Psychomotor Activity: Normal   Assets  Assets:Communication Skills; Desire for Improvement; Financial Resources/Insurance; Intimacy; Housing; Leisure Time; Physical Health; Social Support; Resilience; Talents/Skills; Transportation   Sleep  Sleep:Sleep: Fair   Nutritional Assessment (For OBS and [facility]BC admissions only) Has the patient had a weight loss or gain of 10 pounds or more in the last 3 months?: No Has the patient had a decrease in food intake/or appetite?: No Does the patient have dental problems?: No Does the patient have eating habits or behaviors that may be indicators of an eating disorder including binging or inducing vomiting?: No Has the patient recently lost weight without trying?: No Has the patient been eating poorly because of a decreased appetite?: No Malnutrition Screening Tool Score: 0   Physical Exam Vitals and nursing note reviewed.  Constitutional:      Appearance: Normal appearance. She is well-developed and normal weight.  HENT:     Head: Normocephalic and atraumatic.     Nose: Nose normal.  Cardiovascular:     Rate and Rhythm: Normal rate.  Pulmonary:  Effort: Pulmonary effort is normal.  Musculoskeletal:        General: Normal range of motion.     Cervical back: Normal range of motion.  Skin:    General: Skin is warm and dry.  Neurological:     Mental Status: She is alert and oriented to person, place, and time.  Psychiatric:        Attention and Perception: Attention normal. She perceives  visual hallucinations.        Mood and Affect: Mood is depressed. Affect is tearful.        Speech: Speech normal.        Behavior: Behavior normal. Behavior is cooperative.        Thought Content: Thought content normal.        Cognition and Memory: Cognition and memory normal.        Judgment: Judgment normal.   Review of Systems  Constitutional: Negative.   HENT: Negative.    Eyes: Negative.   Respiratory: Negative.    Cardiovascular: Negative.   Gastrointestinal: Negative.   Genitourinary: Negative.   Musculoskeletal: Negative.   Skin: Negative.   Neurological: Negative.   Endo/Heme/Allergies: Negative.   Psychiatric/Behavioral:  Positive for depression, hallucinations and substance abuse.    Blood pressure (!) 130/98, pulse 92, temperature 98.8 F (37.1 C), temperature source Oral, resp. rate 16, SpO2 100 %. There is no height or weight on file to calculate BMI.  Past Psychiatric History: Patient reports history of depression  Is the patient at risk to self? No  Has the patient been a risk to self in the past 6 months? No .    Has the patient been a risk to self within the distant past? Yes   Is the patient a risk to others? No   Has the patient been a risk to others in the past 6 months? No   Has the patient been a risk to others within the distant past? No   Past Medical History:  Past Medical History:  Diagnosis Date  . Abnormal Pap smear   . Depression    hx of depression - not taking medication  . Heart murmur 35 yrs old   not sure if it is still present.  Marland Kitchen PIH (pregnancy induced hypertension), antepartum 11/19/2010   No past surgical history on file.  Family History:  Family History  Problem Relation Age of Onset  . Depression Mother   . Early death Father        HIV  . Arthritis Sister   . Diabetes Maternal Aunt   . Early death Maternal Aunt   . Alcohol abuse Maternal Uncle   . Diabetes Maternal Uncle   . Diabetes Paternal Aunt   . Diabetes  Paternal Uncle   . Arthritis Maternal Grandmother   . Diabetes Maternal Grandmother   . Heart disease Maternal Grandmother   . Stroke Maternal Grandmother   . Diabetes Maternal Grandfather     Social History:  Social History   Socioeconomic History  . Marital status: Single    Spouse name: Not on file  . Number of children: Not on file  . Years of education: Not on file  . Highest education level: Not on file  Occupational History  . Not on file  Tobacco Use  . Smoking status: Every Day    Packs/day: 0.50    Types: Cigarettes  . Smokeless tobacco: Never  Vaping Use  . Vaping Use: Never used  Substance and Sexual  Activity  . Alcohol use: No  . Drug use: Not Currently    Types: Cocaine  . Sexual activity: Yes    Birth control/protection: Implant  Other Topics Concern  . Not on file  Social History Narrative  . Not on file   Social Determinants of Health   Financial Resource Strain: Not on file  Food Insecurity: Not on file  Transportation Needs: Not on file  Physical Activity: Not on file  Stress: Not on file  Social Connections: Not on file  Intimate Partner Violence: Not on file    SDOH:  SDOH Screenings   Alcohol Screen: Not on file  Depression (PHQ2-9): Medium Risk  . PHQ-2 Score: 15  Financial Resource Strain: Not on file  Food Insecurity: Not on file  Housing: Not on file  Physical Activity: Not on file  Social Connections: Not on file  Stress: Not on file  Tobacco Use: High Risk  . Smoking Tobacco Use: Every Day  . Smokeless Tobacco Use: Never  Transportation Needs: Not on file    Last Labs:  Admission on 08/15/2020  Component Date Value Ref Range Status  . POC Amphetamine UR 08/15/2020 None Detected  NONE DETECTED (Cut Off Level 1000 ng/mL) Final  . POC Secobarbital (BAR) 08/15/2020 None Detected  NONE DETECTED (Cut Off Level 300 ng/mL) Final  . POC Buprenorphine (BUP) 08/15/2020 None Detected  NONE DETECTED (Cut Off Level 10 ng/mL) Final   . POC Oxazepam (BZO) 08/15/2020 None Detected  NONE DETECTED (Cut Off Level 300 ng/mL) Final  . POC Cocaine UR 08/15/2020 Positive (A) NONE DETECTED (Cut Off Level 300 ng/mL) Final  . POC Methamphetamine UR 08/15/2020 None Detected  NONE DETECTED (Cut Off Level 1000 ng/mL) Final  . POC Morphine 08/15/2020 None Detected  NONE DETECTED (Cut Off Level 300 ng/mL) Final  . POC Oxycodone UR 08/15/2020 Positive (A) NONE DETECTED (Cut Off Level 100 ng/mL) Final  . POC Methadone UR 08/15/2020 None Detected  NONE DETECTED (Cut Off Level 300 ng/mL) Final  . POC Marijuana UR 08/15/2020 Positive (A) NONE DETECTED (Cut Off Level 50 ng/mL) Final  . SARS Coronavirus 2 Ag 08/15/2020 Negative  Negative Final  Office Visit on 06/13/2020  Component Date Value Ref Range Status  . High risk HPV 06/13/2020 Negative   Final  . Adequacy 06/13/2020 Satisfactory for evaluation; transformation zone component PRESENT.   Final  . Diagnosis 06/13/2020 - Negative for intraepithelial lesion or malignancy (NILM)   Final  . Comment 06/13/2020 Normal Reference Range HPV - Negative   Final  . Neisseria Gonorrhea 06/13/2020 Negative   Final  . Chlamydia 06/13/2020 Negative   Final  . Trichomonas 06/13/2020 Negative   Final  . Bacterial Vaginitis (gardnerella) 06/13/2020 Positive (A)  Final  . Candida Vaginitis 06/13/2020 Negative   Final  . Candida Glabrata 06/13/2020 Negative   Final  . Comment 06/13/2020 Normal Reference Range Candida Species - Negative   Final  . Comment 06/13/2020 Normal Reference Range Candida Galbrata - Negative   Final  . Comment 06/13/2020 Normal Reference Range Trichomonas - Negative   Final  . Comment 06/13/2020 Normal Reference Ranger Chlamydia - Negative   Final  . Comment 06/13/2020 Normal Reference Range Neisseria Gonorrhea - Negative   Final  . Comment 06/13/2020 Normal Reference Range Bacterial Vaginosis - Negative   Final  . Hepatitis B Surface Ag 06/13/2020 Negative  Negative Final  . Hep C  Virus Ab 06/13/2020 <0.1  0.0 - 0.9 s/co ratio Final  Comment:                                   Negative:     < 0.8                              Indeterminate: 0.8 - 0.9                                   Positive:     > 0.9  HCV antibody alone does not differentiate between  previous resolved infection and active infection.  The CDC and current clinical guidelines recommend  that a positive HCV antibody result be followed up  with an HCV RNA test to support the diagnosis of  acute HCV infection. [facility] offers Hepatitis C  Virus (HCV) RNA, Diagnosis, NAA (284132) and  Hepatitis C Virus (HCV) Antibody with reflex to  Quantitative Real-time PCR (144050).   Marland Kitchen HIV Screen 4th Generation wRfx 06/13/2020 Non Reactive  Non Reactive Final   Comment: HIV Negative HIV-1/HIV-2 antibodies and HIV-1 p24 antigen were NOT detected. There is no laboratory evidence of HIV infection.   . RPR Ser Ql 06/13/2020 Non Reactive  Non Reactive Final  . Preg Test, Ur 06/13/2020 Negative  Negative Final    Allergies: Penicillins  PTA Medications: (Not in a hospital admission)   Medical Decision Making  Patient reviewed with Dr. Bronwen Betters. Discussed side effects of fluoxetine 20 mg daily to address mood, patient verbalizes understanding and agreement.  Additionally patient agrees with admission to facility based crisis unit and would like to seek substance use treatment as available.  Patient remains voluntary at this time. New medications: -Fluoxetine 20 mg daily -Hydroxyzine 25 mg 3 times daily as needed/anxiety -Trazodone 50 mg nightly as needed/sleep  Restarted home medications including: -Calcium  daily -Folic acid 1g daily -Hydrochlorothiazide  25 mg daily -Prednisone 10 mg each morning -Methotrexate  SQ weekly (scheduled for administration 08/16/2020)    Recommendations  Based on my evaluation the patient does not appear to have an emergency medical condition. Patient admitted to  facility based crisis unit at [facility] behavioral health.  Lenard Lance, FNP 08/15/20  4:23 PM

## 2020-08-15 NOTE — Progress Notes (Signed)
Pt is admitted to [facility] bed 163 due AVH, substance use and passive SI. Pt is alert and oriented with flat/tearful affect. Pt is ambulatory and is oriented to staff/unit. Pt was cooperative with skin assessment. Pt denies SI/HI/AVH at this time. 15 minutes check initiated. Staff will monitor for pt's safety.

## 2020-08-15 NOTE — ED Notes (Signed)
Pt alert and oriented and denies SI/HI, and any pain. Education, support, reassurance, and encouragement provided. Pt's belongings in locker. Pt denies any concerns at this time, and verbally contracts for safety. Pt ambulating on the unit with no issues. Pt remains safe on unit wile awaiting transfer to [facility].
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
## 2020-08-15 NOTE — Group Therapy Note (Signed)
Patient refused to attend wrap up group. 

## 2020-08-15 NOTE — ED Notes (Signed)
Pt sleeping at present, no distress noted, monitoring for safety. 

## 2020-08-15 NOTE — ED Notes (Signed)
Pt sleeping at present, no distress noted.  Monitoring for safety. 

## 2020-08-16 DIAGNOSIS — F1410 Cocaine abuse, uncomplicated: Secondary | ICD-10-CM | POA: Diagnosis not present

## 2020-08-16 DIAGNOSIS — F333 Major depressive disorder, recurrent, severe with psychotic symptoms: Secondary | ICD-10-CM | POA: Diagnosis not present

## 2020-08-16 DIAGNOSIS — Z20822 Contact with and (suspected) exposure to covid-19: Secondary | ICD-10-CM | POA: Diagnosis not present

## 2020-08-16 DIAGNOSIS — F1994 Other psychoactive substance use, unspecified with psychoactive substance-induced mood disorder: Secondary | ICD-10-CM | POA: Diagnosis not present

## 2020-08-16 LAB — URINALYSIS, ROUTINE W REFLEX MICROSCOPIC
Bacteria, UA: NONE SEEN
Bilirubin Urine: NEGATIVE
Glucose, UA: NEGATIVE mg/dL
Ketones, ur: 5 mg/dL — AB
Leukocytes,Ua: NEGATIVE
Nitrite: NEGATIVE
Protein, ur: NEGATIVE mg/dL
Specific Gravity, Urine: 1.016 (ref 1.005–1.030)
pH: 5 (ref 5.0–8.0)

## 2020-08-16 LAB — PROLACTIN: Prolactin: 3.5 ng/mL — ABNORMAL LOW (ref 4.8–23.3)

## 2020-08-16 MED ORDER — OLANZAPINE 5 MG PO TBDP
5.0000 mg | ORAL_TABLET | Freq: Every day | ORAL | Status: DC
  Filled 2020-08-16: qty 1

## 2020-08-16 MED ORDER — POTASSIUM CHLORIDE CRYS ER 20 MEQ PO TBCR
20.0000 meq | EXTENDED_RELEASE_TABLET | Freq: Two times a day (BID) | ORAL | Status: DC
  Administered 2020-08-16: 20 meq via ORAL
  Filled 2020-08-16 (×3): qty 1

## 2020-08-16 NOTE — Group Therapy Note (Addendum)
Problem Solving & Overcoming Obstacles Tuesday - Problem Solving & Overcoming Obstacles   Date: 08/16/20  Type of Therapy/Therapeutic Modalities: Group, CBT, Motivational Interviewing, Solution-Focused   Participation Level: Active  Objective - In this group patients will be encouraged to explore what they see as problems/obstacles to their own wellness and recovery. They will be guided to discuss their thoughts, feelings, and behaviors related to these obstacles. The group will process together ways to cope with barriers, with attention given to specific choices patients can make. Each patient will be challenged to identify changes they are motivated to make to overcome their obstacles. This group will be process-oriented, with patients participating in exploration of their own experiences as well as giving and receiving support and challenge from other group members.  Therapeutic Goals:  1. Patient will identify personal and current obstacles as they relate to admission. 2. Patient will identify barriers that currently interfere with their wellness or overcoming obstacles.  3. Patient will identify feelings, thought process and behaviors related to these barriers. 4. Patient will identify two changes they are willing to make to overcome these obstacles:   Summary of Patient Progress  [place] was present in group. She was tearful. She identified addiction as her problem but was unable to identify solutions other than "It's me, it's my fault". Chudney asked to be excused part way through group.

## 2020-08-16 NOTE — Progress Notes (Signed)
  Pharmacy Brief note: SQ Methotrexate   Hold Criteria for:   Methotrexate (Trexall; Rheumatrex) ?  Hgb < 8 ?  WBC < 3000 ?  Pltc < 100K   SCr > 1.5x baseline (or > 2 if baseline unknown) ?  AST or ALT >3x ULN   Bili > 1.5x ULN  Ascites or pleural effusion  ?  Diarrhea - Grade 2 or higher* ?  Ulcerative stomatitis ?  Unexplained pneumonitis / hypoxemia  Patients labs are WNL. Ok to continue methotrexate as ordered.  Lorenza Evangelist 08/16/2020 8:23 AM
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
## 2020-08-16 NOTE — ED Notes (Signed)
Pt sleeping at present.  No distress noted.  Monitoring for safety. 

## 2020-08-16 NOTE — ED Notes (Signed)
Pt c/o increasing anxiety and requests medication. Vistaril 25 mg PRN given.

## 2020-08-16 NOTE — ED Provider Notes (Addendum)
Labs have been reviewed- patient was due for home medication of methotrexate yesterday but did not receive. Patient to receive injection today as it is a home medication. Medication is supplied by patient.  Mother normally administers medication. Ok for patient to be in assessment room with mother to administer medication with RN supervision.

## 2020-08-16 NOTE — ED Notes (Signed)
Pt in dayroom watching tv and talking with other pt. Pt laughing, pleasant, and cooperative. Pt is compliant with medications and denies SI/HI, AVH, and any pain. Pt is ambulatory, alert, oriented x4. Pt remains safe on the unit. Will continue to monitor.

## 2020-08-16 NOTE — ED Notes (Signed)
Patient in the dining room laying across two chairs.  Little to no interaction with other patients or staff. Refuses night medication,  Denies SI at this time.  Denies AVH. Patient in no apparent distress. Will continue to monitor

## 2020-08-16 NOTE — ED Provider Notes (Signed)
Discussed potentially initiating olanzapine 5 mg nightly to address hallucinations with patient and attending provider.  QTC trending toward normal from 488 to current QTC measuring 464. Medication updated to include: -Olanzapine Zydis 5 mg nightly/hallucinations

## 2020-08-16 NOTE — ED Provider Notes (Addendum)
Notified by nursing staff that patient just had her admission EKG done.  Patient's EKG shows a prolonged QT/QTC of 418/488 ms, ventricular rate of 82 bpm, PR interval 128 ms, QRS 72 ms.  Patient denies any chest pain, shortness of breath, nausea, vomiting, abdominal pain, lightheadedness, dizziness, or any additional physical symptoms at this time.  Recommend that dayshift treatment team adjust patient's medications accordingly if necessary based on patient's prolonged QT/QTC.

## 2020-08-16 NOTE — Group Therapy Note (Signed)
Problem Solving & Overcoming Obstacles Tuesday - Problem Solving & Overcoming Obstacles - Wrap Up   Date: 08/16/20  Type of Therapy/Therapeutic Modalities: Group, CBT, Motivational Interviewing, Solution-Focused   Participation Level: Did not Attend  Objective - Did not attend

## 2020-08-16 NOTE — ED Provider Notes (Addendum)
[facility] Admission Suicide Risk Assessment   Nursing information obtained from:  chart    Demographic Factors:  NA  Loss Factors: Decline in physical health  Historical Factors: Impulsivity  Risk Reduction Factors:   Responsible for children under 35 years of age, Sense of responsibility to family, Living with another person, especially a relative, and Positive social support    Total Time spent with patient: 30 minutes Principal Problem: <principal problem not specified> Diagnosis:  Active Problems:   Substance induced mood disorder (HCC)  Subjective Data:  Per H&P Sandra Lee presents voluntarily to [facility] behavioral health for walk-in assessment.  Patient is assessed face-to-face by nurse practitioner. She is seated in assessment area, no acute distress.  She is alert and oriented, pleasant and cooperative during assessment.  She reports depressed mood with congruent affect, tearful throughout assessment.  She denies suicidal and homicidal ideations.  She endorses history of 1 prior suicide attempt, by intentional cocaine overdose, approximately 15 years ago.  She was hospitalized at [facility] at that time.  She denies history of self-harm.  She contracts verbally for safety with this Clinical research associate.  She has normal speech and behavior.  She endorses visual hallucinations, ongoing times approximately 1 year.  She states "I see things, like shadows and lights."  Patient is able to converse coherently with goal-directed thoughts and no distractibility or preoccupation.  Sh denies paranoia.  Objectively there is no evidence of psychosis/mania or delusional thinking.   She reports depressed mood x1 year.  She endorses feeling hopeless.  She states "I just need some medicine or something, I feel like I am going crazy."   Sandra Lee reports she came to [facility] behavioral health for assessment today at the encouragement of her mother.  She reports chronic use of cocaine and marijuana, last  use of both cocaine and marijuana earlier today.  She endorses using "drugs to help with the pain but it is making things worse."  She reports readiness to stop use of cocaine and marijuana.  She has not been treated for substance use in the past.  Related to substance use she states "I just feel so ashamed."   She reports recent stressors include upcoming move.  She states she has been offered housing for [employer] but her home will be pad-locked in 2 weeks because the "landlord did not do something right."  She reports feeling overwhelming stress related to this situation as well as substance use.   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  She reports she missed her infusion appointment on last Friday.  She reports she has not been compliant with medications over the last several days related to "drugs in my system."  She does attempt to remain compliant with medications and reports plan to remain compliant with medications moving forward. She reports concern about her overall physical health. She reports current medications include Nexplanon, calcium daily and folic acid daily.  She reports hydrochlorothiazide 25 mg daily.  Last dose of hydrochlorothiazide, calcium and folic acid on Monday of this week.  She has been prescribed prednisone for approximately 3 years, she was recently referred to a rheumatologist at [facility].  On July 11, the rheumatologist at [facility] decreased prednisone from 15 mg daily to current dose 10 mg daily, her last dose of prednisone was on Monday morning of this week.  She reports she would not take prednisone in  the afternoon as she would be unable to sleep, would like to resume prednisone 10 mg tomorrow.  She has been prescribed methotrexate for the last 3 years, the rheumatologist at [facility] updated methotrexate dose from by mouth route to  subcutaneous route in July 2022.  Currently prescribed methotrexate 25 mg subcutaneously,  weekly.  She has received methotrexate into the skin only twice, scheduled to receive subcutaneous methotrexate today however did not receive this medication today related to her mood.  She also receives Rituxan infusion every 6 months, she missed an appointment for this infusion on last Friday, plans to reschedule.     Sandra Lee is not followed by outpatient psychiatry currently, she denies any psychotropic medications.  She reports she has been diagnosed with depression in the past.   She resides in [place] with her 33-year-old son.  She denies access to weapons.  She is currently not employed.  She endorses average sleep and appetite.   Patient offered support and encouragement.  She gives verbal consent to speak with her mother, Cindra Presume.  Patient's mother reports she assists [name] with weekly injection of methotrexate 25 mg each Wednesday.  She reports patient refused medication today citing her depressed mood.        CLINICAL FACTORS:   Depression:   Anhedonia Medical Diagnoses and Treatments/Surgeries   Musculoskeletal: Strength & Muscle Tone: within normal limits Gait & Station: normal Patient leans: N/A  Psychiatric Specialty Exam:  Presentation  General Appearance: Appropriate for Environment; Casual  Eye Contact:Good  Speech:Clear and Coherent; Normal Rate  Speech Volume:Normal  Handedness:Right   Mood and Affect  Mood:Depressed; Dysphoric  Affect:Appropriate; Congruent; Tearful; Depressed   Thought Process  Thought Processes:Coherent; Goal Directed; Linear  Descriptions of Associations:Intact  Orientation:Full (Time, Place and Person)  Thought Content:WDL; Logical  History of Schizophrenia/Schizoaffective disorder:No  Duration of Psychotic Symptoms:Greater than six months  Hallucinations:Hallucinations: Auditory; Visual Description of Auditory  Hallucinations: unable to describe Description of Visual Hallucinations: shadows  Ideas of Reference:None  Suicidal Thoughts:Suicidal Thoughts: No  Homicidal Thoughts:Homicidal Thoughts: No   Sensorium  Memory:Immediate Good  Judgment:Fair  Insight:Fair   Executive Functions  Concentration:Good  Attention Span:Good  Recall:Good  Fund of Knowledge:Good  Language:Good   Psychomotor Activity  Psychomotor Activity:Psychomotor Activity: Normal   Assets  Assets:Communication Skills; Desire for Improvement; Financial Resources/Insurance; Housing; Social Support; Resilience   Sleep  Sleep:Sleep: Fair    Physical Exam: Physical Exam Constitutional:      Appearance: Normal appearance.  HENT:     Head: Normocephalic and atraumatic.  Pulmonary:     Effort: Pulmonary effort is normal.  Neurological:     Mental Status: She is alert.   Review of Systems  Constitutional:  Negative for chills and fever.  HENT:  Negative for hearing loss.   Eyes:  Negative for discharge and redness.  Respiratory:  Negative for cough.   Cardiovascular:  Negative for chest pain.  Gastrointestinal:  Negative for abdominal pain.  Genitourinary:  Positive for dysuria.  Musculoskeletal:  Negative for myalgias.  Neurological:  Negative for headaches.  Psychiatric/Behavioral:  Positive for hallucinations and substance abuse. Negative for suicidal ideas.   Blood pressure (!) 162/117, pulse 77, temperature 98.9 F (37.2 C), temperature source Oral, resp. rate 18, SpO2 97 %. There is no height or weight on file to calculate BMI.   COGNITIVE FEATURES THAT CONTRIBUTE TO RISK:  Thought constriction (tunnel vision)    SUICIDE RISK:   Minimal: No identifiable suicidal ideation.  Patients  presenting with no risk factors but with morbid ruminations; may be classified as minimal risk based on the severity of the depressive symptoms  PLAN OF CARE:  Patient seen and chart reviewed. Pt presented  with AVH and low mood in the context of persistent cocaine use. UDS+ cocaine, oxycodone, marijuana. K mildly low at 3.5. EKG with qtc 488. She reports a previous dx of bipolar disorder; however, describes getting this dx in the context of cocaine use and has been using cocaine daily for ~15 years. Low potassium is a/w prolonged QTC- Patient's potassium was low at 3.5; will order potassium supplementation prior to starting antipsychotic for ongoing hallucinations. She is also reporting dysuria-will order UA and follow up results and treat as appropriate.  Will continue medications as mentioned in H&P and will consider adding zyprexa for hallucinations once K is repleted and repeat EKG completed if deemed appropriate/safe   -Hydroxyzine 25 mg 3 times daily as needed/anxiety -Trazodone 50 mg nightly as needed/sleep   Restarted home medications including: -Calcium 500mg  daily -Folic acid 1g daily -Hydrochlorothiazide  25 mg daily -Prednisone 10 mg each morning -Methotrexate 25mg  SQ weekly (scheduled for administration 08/16/2020)  I certify that inpatient services furnished can reasonably be expected to improve the patient's condition.   , MD 08/16/2020, 12:39 PM

## 2020-08-17 DIAGNOSIS — F1410 Cocaine abuse, uncomplicated: Secondary | ICD-10-CM | POA: Diagnosis not present

## 2020-08-17 DIAGNOSIS — F333 Major depressive disorder, recurrent, severe with psychotic symptoms: Secondary | ICD-10-CM | POA: Diagnosis not present

## 2020-08-17 DIAGNOSIS — Z20822 Contact with and (suspected) exposure to covid-19: Secondary | ICD-10-CM | POA: Diagnosis not present

## 2020-08-17 DIAGNOSIS — F1994 Other psychoactive substance use, unspecified with psychoactive substance-induced mood disorder: Secondary | ICD-10-CM | POA: Diagnosis not present

## 2020-08-17 NOTE — ED Notes (Signed)
Patient resting. No sxs of distress noted. Will continue to monitor for safety 

## 2020-08-17 NOTE — ED Provider Notes (Signed)
FBC/OBS ASAP Discharge Summary  Date and Time: 08/17/2020 10:11 AM  Name: Sandra Lee  MRN:  [id number]   Discharge Diagnoses:  Final diagnoses:  Substance induced mood disorder ([place])  Cocaine abuse ([place])    Subjective:  Patient seen in conjunction with SW this AM. She denied SI/HI/AVH and requested discharge. See below for additional details.  Stay Summary:  Patient presented to the [facility] on 8/3 reporting hallucinations in the setting of cocaine use, last used prior to presentation to the [facility] and had been using for 15 years. Routine labs were drawn. UDS+cocaine, oxy, marijuana. EKG revealed qtc of 488. K mildly low at 3.5. Potassium chloride 20 mg BID ordered prior to starting antipsychotic as hypokalemia is a/w prolonged qtc. Patient's mother presented to the [facility] on 8/4 to give methotrexate injection that she normally takes at home-this was supervised by RN. Zyprexa 5 mg qhs was started after patient received potassium and repeat EKG qtc 464. Zyprexa 5 mg was to start on 8/4; however, per documentation patient refused medication. Morning of 8/5, day of discharge patient expressed frustration about not being found residential  substance use treatment and not being provided medications "for my mental health". See above for details-met with patient in conjunction with SW. Attempted to explain that there has been no bed availability at rehab facilities since she has been at the [facility]  and that medication had been ordered and had been attempted to be given yesterday. Patient became increasingly upset and requested discharge and expressed that she did not decline medication. She denies SI/HI/AVH. Resources for outpatient services were provided at discharge  Total Time spent with patient: 15 minutes  Past Psychiatric History: cocaine use, SIMD, self reported history of bipolar and depression Past Medical History:  Past Medical History:  Diagnosis Date   Abnormal Pap smear    Depression    hx of  depression - not taking medication   Heart murmur 35 yrs old   not sure if it is still present.   PIH (pregnancy induced hypertension), antepartum 11/19/2010   No past surgical history on file. Family History:  Family History  Problem Relation Age of Onset   Depression Mother    Early death Father        HIV   Arthritis Sister    Diabetes Maternal Aunt    Early death Maternal Aunt    Alcohol abuse Maternal Uncle    Diabetes Maternal Uncle    Diabetes Paternal Aunt    Diabetes Paternal Uncle    Arthritis Maternal Grandmother    Diabetes Maternal Grandmother    Heart disease Maternal Grandmother    Stroke Maternal Grandmother    Diabetes Maternal Grandfather    Family Psychiatric History: see H&P Social History:  Social History   Substance and Sexual Activity  Alcohol Use No     Social History   Substance and Sexual Activity  Drug Use Not Currently   Types: Cocaine    Social History   Socioeconomic History   Marital status: Single    Spouse name: Not on file   Number of children: Not on file   Years of education: Not on file   Highest education level: Not on file  Occupational History   Not on file  Tobacco Use   Smoking status: Every Day    Packs/day: 0.50    Types: Cigarettes   Smokeless tobacco: Never  Vaping Use   Vaping Use: Never used  Substance and Sexual Activity  Alcohol use: No   Drug use: Not Currently    Types: Cocaine   Sexual activity: Yes    Birth control/protection: Implant  Other Topics Concern   Not on file  Social History Narrative   Not on file   Social Determinants of Health   Financial Resource Strain: Not on file  Food Insecurity: Not on file  Transportation Needs: Not on file  Physical Activity: Not on file  Stress: Not on file  Social Connections: Not on file   SDOH:  SDOH Screenings   Alcohol Screen: Not on file  Depression (PHQ2-9): Medium Risk   PHQ-2 Score: 15  Financial Resource Strain: Not on file  Food  Insecurity: Not on file  Housing: Not on file  Physical Activity: Not on file  Social Connections: Not on file  Stress: Not on file  Tobacco Use: High Risk   Smoking Tobacco Use: Every Day   Smokeless Tobacco Use: Never  Transportation Needs: Not on file    Tobacco Cessation:  Prescription not provided because: n/a  Current Medications:  Current Facility-Administered Medications  Medication Dose Route Frequency Provider Last Rate Last Admin   acetaminophen (TYLENOL) tablet 650 mg  650 mg Oral Q6H PRN Lucky Rathke, FNP       alum & mag hydroxide-simeth (MAALOX/MYLANTA) 200-200-20 MG/5ML suspension 30 mL  30 mL Oral Q4H PRN Lucky Rathke, FNP       calcium carbonate (OS-CAL - dosed in mg of elemental calcium) tablet 500 mg of elemental calcium  500 mg of elemental calcium Oral Q breakfast Lucky Rathke, FNP   500 mg of elemental calcium at 25/05/39 7673   folic acid (FOLVITE) tablet 1 mg  1 mg Oral Daily Lucky Rathke, FNP   1 mg at 08/16/20 0933   hydrochlorothiazide (HYDRODIURIL) tablet 25 mg  25 mg Oral Daily Lucky Rathke, FNP   25 mg at 08/16/20 4193   hydrOXYzine (ATARAX/VISTARIL) tablet 25 mg  25 mg Oral TID PRN Lucky Rathke, FNP   25 mg at 08/16/20 1021   magnesium hydroxide (MILK OF MAGNESIA) suspension 30 mL  30 mL Oral Daily PRN Lucky Rathke, FNP       methotrexate chemo injection 25 mg  25 mg Subcutaneous Weekly Lucky Rathke, FNP   25 mg at 08/16/20 1315   OLANZapine zydis (ZYPREXA) disintegrating tablet 5 mg  5 mg Oral QHS Lucky Rathke, FNP       potassium chloride SA (KLOR-CON) CR tablet 20 mEq  20 mEq Oral BID Ival Bible, MD   20 mEq at 08/16/20 1243   predniSONE (DELTASONE) tablet 10 mg  10 mg Oral Q breakfast Lucky Rathke, FNP   10 mg at 08/16/20 7902   traZODone (DESYREL) tablet 50 mg  50 mg Oral QHS PRN Lucky Rathke, FNP       Current Outpatient Medications  Medication Sig Dispense Refill   calcium carbonate (OSCAL) 1500 (600 Ca) MG TABS tablet Take 1  tablet by mouth in the morning and at bedtime.     etonogestrel (NEXPLANON) 68 MG IMPL implant 68 mg by Subdermal route once.      folic acid (FOLVITE) 1 MG tablet Take 1 mg by mouth daily.     hydrochlorothiazide (HYDRODIURIL) 25 MG tablet Take 25 mg by mouth daily.     methotrexate 50 MG/2ML injection Inject 25 mg into the skin every Wednesday.     Multiple Vitamin (MULTIVITAMIN  WITH MINERALS) TABS tablet Take 1 tablet by mouth daily.     mupirocin ointment (BACTROBAN) 2 % Place 1 application into the nose in the morning and at bedtime. Place 1 inch strip in saline mixture and shake well to mix, rinse bilateral nasal cavities.     predniSONE (DELTASONE) 5 MG tablet Take 10 mg by mouth daily.     sodium chloride (OCEAN) 0.65 % nasal spray Place 2 sprays into the nose daily as needed for congestion.      PTA Medications: (Not in a hospital admission)   Musculoskeletal  Strength & Muscle Tone: within normal limits Gait & Station: normal Patient leans: N/A  Psychiatric Specialty Exam  Presentation  General Appearance: Appropriate for Environment; Casual  Eye Contact:Good  Speech:Clear and Coherent; Normal Rate; Other (comment) (angry tone of voice)  Speech Volume:Other (comment) (mostly normal, loud at times)  Handedness:Right   Mood and Affect  Mood:Irritable  Affect:Appropriate; Congruent   Thought Process  Thought Processes:Coherent; Goal Directed; Linear  Descriptions of Associations:Intact  Orientation:Full (Time, Place and Person)  Thought Content:WDL; Logical  Diagnosis of Schizophrenia or Schizoaffective disorder in past: No  Duration of Psychotic Symptoms: Greater than six months   Hallucinations:Hallucinations: None Description of Auditory Hallucinations: unable to describe Description of Visual Hallucinations: shadows  Ideas of Reference:None  Suicidal Thoughts:Suicidal Thoughts: No  Homicidal Thoughts:Homicidal Thoughts: No   Sensorium   Memory:Immediate Good; Recent Good; Remote Good  Judgment:Fair  Insight:Fair   Executive Functions  Concentration:Good  Attention Span:Good  [place] of Knowledge:Good  Language:Good   Psychomotor Activity  Psychomotor Activity:Psychomotor Activity: Normal   Assets  Assets:Communication Skills; Desire for Improvement; Financial Resources/Insurance; Housing; Resilience; Social Support   Sleep  Sleep:Sleep: Fair   No data recorded  Physical Exam  Physical Exam Constitutional:      Appearance: Normal appearance. She is normal weight. HENT:    Head: Normocephalic and atraumatic. Eyes:    Extraocular Movements: Extraocular movements intact. Pulmonary:    Effort: Pulmonary effort is normal. Neurological:    Mental Status: She is alert.    Review of Systems Constitutional:  Negative for chills and fever. HENT:  Negative for hearing loss.   Eyes:  Negative for discharge and redness. Respiratory:  Negative for cough.   Cardiovascular:  Negative for chest pain. Gastrointestinal:  Negative for abdominal pain. Musculoskeletal:  Negative for myalgias. Neurological:  Negative for headaches. Psychiatric/Behavioral:  Positive for substance abuse. Negative for hallucinations and suicidal ideas.  Blood pressure (!) 143/103, pulse 93, temperature 98.9 F (37.2 C), temperature source Oral, resp. rate 18, SpO2 99 %. There is no height or weight on file to calculate BMI.  Demographic Factors:  NA  Loss Factors: Financial problems/change in socioeconomic status  Historical Factors: Prior suicide attempts and Impulsivity  Risk Reduction Factors:   Responsible for children under 41 years of age, Sense of responsibility to family, Living with another person, especially a relative, and Positive social support  Continued Clinical Symptoms:  Alcohol/Substance Abuse/Dependencies  Cognitive Features That Contribute To Risk:  Thought constriction (tunnel vision)     Suicide Risk:  Minimal: No identifiable suicidal ideation.  Patients presenting with no risk factors but with morbid ruminations; may be classified as minimal risk based on the severity of the depressive symptoms  Plan Of Care/Follow-up recommendations:  Patient is instructed prior to discharge to: Take all medications as prescribed by his/her mental healthcare provider. Report any adverse effects and or reactions from the medicines to his/her  outpatient provider promptly. Patient has been instructed & cautioned: To not engage in alcohol and or illegal drug use while on prescription medicines. In the event of worsening symptoms, patient is instructed to call the crisis hotline, 911 and or go to the nearest ED for appropriate evaluation and treatment of symptoms. To follow-up with his/her primary care provider for your other medical issues, concerns and or health care needs.     Disposition:  Self care  Ival Bible, MD 08/17/2020, 10:11 AM

## 2020-08-17 NOTE — ED Notes (Signed)
Patient out this AM.  Greeted staff and went to breakfast.  Patient then came out and made a phone call.  Told person on phone that she wasn't given any hot food and hadn't been given any medication since she arrived.  "I could have stayed home and done this. Benadryl.  That's all I've been given is Benadryl.  I need medication."  Nurse at nurse's station.  Told patient that I could give her AM medication.  Patient stated, "Now I can't even have a damn conversation."  Patient went to room.  Allowed time alone in room.  Approached patient and asked if she wanted her medications.  Patient yelling, "Oh, now you want to give me medication.  Why haven't you offered me anything before."  Explained that she had told nurses she didn't want to take it.  Patient stated she didn't refuse anything.

## 2020-08-17 NOTE — ED Provider Notes (Addendum)
[facility] Discharge Suicide Risk Assessment   Principal Problem: Substance induced mood disorder (HCC) Discharge Diagnoses: Principal Problem:   Substance induced mood disorder (HCC) Active Problems:   Cocaine abuse (HCC)   Total Time spent with patient: 15 minutes  Musculoskeletal: Strength & Muscle Tone: within normal limits Gait & Station: normal Patient leans: N/A  Psychiatric Specialty Exam  Presentation  General Appearance: Appropriate for Environment; Casual  Eye Contact:Good  Speech:Clear and Coherent; Normal Rate; Other (comment) (angry tone of voice)  Speech Volume:Other (comment) (mostly normal, loud at times)  Handedness:Right   Mood and Affect  Mood:Irritable  Duration of Depression Symptoms: Greater than two weeks  Affect:Appropriate; Congruent   Thought Process  Thought Processes:Coherent; Goal Directed; Linear  Descriptions of Associations:Intact  Orientation:Full (Time, Place and Person)  Thought Content:WDL; Logical  History of Schizophrenia/Schizoaffective disorder:No  Duration of Psychotic Symptoms:Greater than six months  Hallucinations:Hallucinations: None Description of Auditory Hallucinations: unable to describe Description of Visual Hallucinations: shadows  Ideas of Reference:None  Suicidal Thoughts:Suicidal Thoughts: No  Homicidal Thoughts:Homicidal Thoughts: No   Sensorium  Memory:Immediate Good; Recent Good; Remote Good  Judgment:Fair  Insight:Fair   Executive Functions  Concentration:Good  Attention Span:Good  Recall:Good  Fund of Knowledge:Good  Language:Good   Psychomotor Activity  Psychomotor Activity:Psychomotor Activity: Normal   Assets  Assets:Communication Skills; Desire for Improvement; Financial Resources/Insurance; Housing; Resilience; Social Support   Sleep  Sleep:Sleep: Fair   Physical Exam: Physical Exam Constitutional:      Appearance: Normal appearance. She is normal weight.  HENT:      Head: Normocephalic and atraumatic.  Eyes:     Extraocular Movements: Extraocular movements intact.  Pulmonary:     Effort: Pulmonary effort is normal.  Neurological:     Mental Status: She is alert.   Review of Systems  Constitutional:  Negative for chills and fever.  HENT:  Negative for hearing loss.   Eyes:  Negative for discharge and redness.  Respiratory:  Negative for cough.   Cardiovascular:  Negative for chest pain.  Gastrointestinal:  Negative for abdominal pain.  Musculoskeletal:  Negative for myalgias.  Neurological:  Negative for headaches.  Psychiatric/Behavioral:  Positive for substance abuse. Negative for hallucinations and suicidal ideas. The patient is nervous/anxious.   Blood pressure (!) 143/103, pulse 93, temperature 98.9 F (37.2 C), temperature source Oral, resp. rate 18, SpO2 99 %. There is no height or weight on file to calculate BMI.  Mental Status Per Nursing Assessment::   On Admission:   denied SI  Demographic Factors:  NA  Loss Factors: Decline in physical health and Financial problems/change in socioeconomic status  Historical Factors: Prior suicide attempts and Impulsivity  Risk Reduction Factors:   Responsible for children under 27 years of age, Sense of responsibility to family, Living with another person, especially a relative, and Positive social support  Continued Clinical Symptoms:  Alcohol/Substance Abuse/Dependencies  Cognitive Features That Contribute To Risk:  Thought constriction (tunnel vision)    Suicide Risk:  Minimal: No identifiable suicidal ideation.  Patients presenting with no risk factors but with morbid ruminations; may be classified as minimal risk based on the severity of the depressive symptoms   Follow-up Information     [facility]. Go to.   Specialty: Behavioral Health Why: Walk-in hours are Monday-Thursday from 7:45am-11:00am to be seen for psychiatric services. Be sure to  have your discharge paperwork available. Contact information: [address] [place] [address] [phone]  [facility]. Call.   Specialty: Behavioral Health Why: Please contact for interest in OUTPATIENT substance abuse services. Contact information: [address] [place] [address] [phone]         [facility] Follow up on 09/10/2020.   Specialty: Behavioral Health Why: Your medication management appointment is 09/10/20 at 8:30am with Nanine Means, NP. This appointment will be held virtual. A link will be emailed to you.  Your therapy appointment is 09/27/20 at 8:00am with Marybelle Killings. This appointmet will be in-person. Please call office for any questions or concerns. Contact information: [address] [place] [address] [phone]                Plan Of Care/Follow-up recommendations:  Activity:  as tolerated Diet:  regular Other:      In the event of worsening symptoms, patient is instructed to call the crisis hotline, 911 and or go to the nearest ED for appropriate evaluation and treatment of symptoms. To follow-up with his/her primary care provider for your other medical issues, concerns and or health care needs.   Patient provided with follow up after discharge for MH needs as well as substance use   Estella Husk, MD 08/17/2020, 10:15 AM

## 2020-08-17 NOTE — ED Notes (Signed)
Spirituality group facilitated by Wilkie Aye, MDiv, BCC.  Group Description:  Group focused on topic of hope.  Patients participated in facilitated discussion around topic, connecting with one another around experiences and definitions for hope.  Group members engaged with visual explorer photos, reflecting on what hope looks like for them today.  Group engaged in discussion around how their definitions of hope are present today in hospital.   Modalities: Psycho-social ed, Adlerian, Narrative, MI Patient Progress: PT invited to group.  Did not attend

## 2020-08-17 NOTE — ED Notes (Signed)
Patient continues to rest. No sxs of distess. Will continue to monitor for safety

## 2020-08-17 NOTE — Discharge Instructions (Addendum)
   Please come to [facility] (this facility) during walk in hours for appointment with psychiatrist for further medication management and for therapists for therapy.    Walk in hours are 8-11 AM Monday through Thursday for medication management. Therapy walk in hours are Monday-Wednesday 8 AM-1PM.   It is first come, first -serve; it is best to arrive by 7:00 AM.   On Friday from 1 pm to 4 pm for therapy intake only. Please arrive by 12:00 pm as it is  first come, first -serve.    When you arrive please go upstairs for your appointment. If you are unsure of where to go, inform the front desk that you are here for a walk in appointment and they will assist you with directions upstairs.  Address:  [address], in [place], [address] Ph: [phone]   

## 2020-08-17 NOTE — Progress Notes (Signed)
Patient refused to take AM medications. Patient stated that she "wasn't refusing though" because she didn't want staff "lying on her." Reviewed AVS with patient and resources given.  Patient verbalized understanding.  Patient ambulated independently to locker room and all belongings returned by security to patient.  Patient discharged in stable condition; no acute distress noted.

## 2020-09-10 ENCOUNTER — Ambulatory Visit (INDEPENDENT_AMBULATORY_CARE_PROVIDER_SITE_OTHER): Payer: Medicaid Other | Admitting: Family

## 2020-09-10 ENCOUNTER — Encounter (HOSPITAL_COMMUNITY): Payer: Self-pay | Admitting: Family

## 2020-09-10 DIAGNOSIS — F1994 Other psychoactive substance use, unspecified with psychoactive substance-induced mood disorder: Secondary | ICD-10-CM

## 2020-09-10 DIAGNOSIS — F333 Major depressive disorder, recurrent, severe with psychotic symptoms: Secondary | ICD-10-CM

## 2020-09-10 MED ORDER — HYDROXYZINE HCL 25 MG PO TABS: 25.0000 mg | ORAL_TABLET | Freq: Every evening | ORAL | 0 refills | Status: DC | PRN

## 2020-09-10 MED ORDER — SERTRALINE HCL 25 MG PO TABS: 25.0000 mg | ORAL_TABLET | Freq: Every day | ORAL | 2 refills | Status: DC

## 2020-09-10 MED ORDER — ARIPIPRAZOLE 5 MG PO TABS: 5.0000 mg | ORAL_TABLET | Freq: Every day | ORAL | 2 refills | Status: DC

## 2020-09-10 NOTE — Progress Notes (Addendum)
Virtual Visit via Telephone Note  I connected with Sandra Lee on 09/10/20 at  8:30 AM EDT by telephone and verified that I am speaking with the correct person using two identifiers.  Location: Patient: Home  Provider:  Office   I discussed the limitations, risks, security and privacy concerns of performing an evaluation and management service by telephone and the availability of in person appointments. I also discussed with the patient that there may be a patient responsible charge related to this service. The patient expressed understanding and agreed to proceed.    I discussed the assessment and treatment plan with the patient. The patient was provided an opportunity to ask questions and all were answered. The patient agreed with the plan and demonstrated an understanding of the instructions.   The patient was advised to call back or seek an in-person evaluation if the symptoms worsen or if the condition fails to improve as anticipated.  I provided 15 minutes of non-face-to-face time during this encounter.   Sandra Rack, NP    Psychiatric Initial Adult Assessment   Patient Identification: Sandra Lee MRN:  [id number] Date of Evaluation:  09/10/2020 Referral Source: Urgent Care Chief Complaint:  Depression and Suicidal Ideation Visit Diagnosis:    ICD-10-CM   1. Severe episode of recurrent major depressive disorder, with psychotic features (HCC)  F33.3     2. Substance induced mood disorder (HCC)  F19.94       History of Present Illness:  Sandra Lee is a 35 year old female who presents due to worsening depression and passive suicidal ideations.  States she was recently seen and evaluated at the local [facility].  She reports a history of bipolar bipolar,  depression disorder, generalized anxiety disorder and substance abuse history.  Reports she was prescribed Seroquel in the past however denied that she had to follow-up with outpatient  providers.  Denied any previous inpatient admissions prior to earlier this month.  States she last used cocaine a week ago.  Patient attributed her worsening depression symptoms due to chemo medication. "  I think the methotrexate is causing my depression to worsen."  Reports she has been self-medicating for a while.  Per chart review Sandra Lee [facility] with cocaine.  Patient reports " I tried nothing was available."  was charted patient experience auditory hallucinations.  However she is currently denying symptoms.  States hallucinations are intermittent. Does continue to endorse depression 8 out of 10 with 10 being the worst.  Patient was initiated on Zyprexa 5 mg p.o. nightly however was reported that patient declined this medication at the time of her inpatient admission.  Discussed initiating Zoloft 25 mg and Abilify 5 mg p.o. daily.  Patient to utilize hydroxyzine 25 mg as needed for insomnia.  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  She reports she missed her infusion appointment on last Friday.  She reports she has not been compliant with medications over the last several days related to "drugs in my system."  She does attempt to remain compliant with medications and reports plan to remain compliant with medications moving forward. She reports concern about her overall physical health. She reports current medications include Nexplanon, calcium daily and folic acid daily.  She reports hydrochlorothiazide 25 mg daily.  Last  dose of hydrochlorothiazide, calcium and folic acid on Monday of this week.  She has been prescribed prednisone for approximately 3 years, she was recently referred to a rheumatologist at [facility]."    Associated  Signs/Symptoms: Depression Symptoms:  depressed mood, feelings of worthlessness/guilt, difficulty concentrating, anxiety, (Hypo) Manic Symptoms:  Distractibility, Hallucinations, Impulsivity, Irritable Mood, Labiality of Mood, Anxiety Symptoms:  Excessive Worry, Psychotic Symptoms:  Hallucinations: None PTSD Symptoms: Avoidance:  Decreased Interest/Participation  Past Psychiatric History:   Previous Psychotropic Medications: No   Substance Abuse History in the last 12 months:  Yes.    Consequences of Substance Abuse: NA  Past Medical History:  Past Medical History:  Diagnosis Date   Abnormal Pap smear    Depression    hx of depression - not taking medication   Heart murmur 35 yrs old   not sure if it is still present.   PIH (pregnancy induced hypertension), antepartum 11/19/2010   No past surgical history on file.  Family Psychiatric History:   Family History:  Family History  Problem Relation Age of Onset   Depression Mother    Early death Father        HIV   Arthritis Sister    Diabetes Maternal Aunt    Early death Maternal Aunt    Alcohol abuse Maternal Uncle    Diabetes Maternal Uncle    Diabetes Paternal Aunt    Diabetes Paternal Uncle    Arthritis Maternal Grandmother    Diabetes Maternal Grandmother    Heart disease Maternal Grandmother    Stroke Maternal Grandmother    Diabetes Maternal Grandfather     Social History:   Social History   Socioeconomic History   Marital status: Single    Spouse name: Not on file   Number of children: Not on file   Years of education: Not on file   Highest education level: Not on file  Occupational History   Not on file  Tobacco Use   Smoking status: Every Day    Packs/day: 0.50    Types: Cigarettes   Smokeless tobacco: Never  Vaping Use   Vaping Use: Never used  Substance and Sexual Activity   Alcohol use: No   Drug use: Not Currently    Types: Cocaine   Sexual activity: Yes    Birth  control/protection: Implant  Other Topics Concern   Not on file  Social History Narrative   Not on file   Social Determinants of Health   Financial Resource Strain: Not on file  Food Insecurity: Not on file  Transportation Needs: Not on file  Physical Activity: Not on file  Stress: Not on file  Social Connections: Not on file    Additional Social History:   Allergies:   Allergies  Allergen Reactions   Penicillins Other (See Comments)    Childhood reaction patient unsure of allergic response    Metabolic Disorder Labs: Lab Results  Component Value Date   HGBA1C 5.9 (H) 08/15/2020   MPG 122.63 08/15/2020   Lab Results  Component Value Date   PROLACTIN 3.5 (L) 08/15/2020   Lab Results  Component Value Date   CHOL 175 08/15/2020   TRIG 93 08/15/2020   HDL 50 08/15/2020   CHOLHDL 3.5 08/15/2020   VLDL 19 08/15/2020   LDLCALC 106 (H) 08/15/2020   Lab Results  Component Value Date   TSH 1.093 08/15/2020    Therapeutic Level Labs: No results found for: LITHIUM No results found for: CBMZ  No results found for: VALPROATE  Current Medications: Current Outpatient Medications  Medication Sig Dispense Refill   ARIPiprazole (ABILIFY) 5 MG tablet Take 1 tablet (5 mg total) by mouth daily. 30 tablet 2   hydrOXYzine (ATARAX/VISTARIL) 25 MG tablet Take 1 tablet (25 mg total) by mouth at bedtime as needed. 30 tablet 0   sertraline (ZOLOFT) 25 MG tablet Take 1 tablet (25 mg total) by mouth daily. 30 tablet 2   calcium carbonate (OSCAL) 1500 (600 Ca) MG TABS tablet Take 1 tablet by mouth in the morning and at bedtime.     etonogestrel (NEXPLANON) 68 MG IMPL implant 68 mg by Subdermal route once.      folic acid (FOLVITE) 1 MG tablet Take 1 mg by mouth daily.     hydrochlorothiazide (HYDRODIURIL) 25 MG tablet Take 25 mg by mouth daily.     methotrexate 50 MG/2ML injection Inject 25 mg into the skin every Wednesday.     Multiple Vitamin (MULTIVITAMIN WITH MINERALS) TABS tablet  Take 1 tablet by mouth daily.     mupirocin ointment (BACTROBAN) 2 % Place 1 application into the nose in the morning and at bedtime. Place 1 inch strip in saline mixture and shake well to mix, rinse bilateral nasal cavities.     predniSONE (DELTASONE) 5 MG tablet Take 10 mg by mouth daily.     sodium chloride (OCEAN) 0.65 % nasal spray Place 2 sprays into the nose daily as needed for congestion.     No current facility-administered medications for this visit.    Musculoskeletal: Strength & Muscle Tone: within normal limits Gait & Station: normal Patient leans: N/A  Psychiatric Specialty Exam: Review of Systems  There were no vitals taken for this visit.There is no height or weight on file to calculate BMI.  General Appearance: NA  Eye Contact:  NA  Speech:  Clear and Coherent  Volume:  Normal  Mood:  Anxious and Depressed  Affect:  NA  Thought Process:  Coherent  Orientation:  Full (Time, Place, and Person)  Thought Content:  Logical  Suicidal Thoughts:  No  Homicidal Thoughts:  No  Memory:  Immediate;   Fair Recent;   Fair  Judgement:  Fair  Insight:  Good  Psychomotor Activity:  Normal  Concentration:  Concentration: Good  Recall:  Good  Fund of Knowledge:Fair  Language: Fair  Akathisia:  No  Handed:  Right  AIMS (if indicated):  done  Assets:  Desire for Improvement Resilience Social Support  ADL's:  Intact  Cognition: WNL  Sleep:  Fair   Screenings: PHQ2-9    Flowsheet Row ED from 08/15/2020 in [facility]  PHQ-2 Total Score 4  PHQ-9 Total Score 15      Flowsheet Row ED from 08/15/2020 in [facility]  C-SSRS RISK CATEGORY No Risk       Assessment and Plan:   Major Depression: Substances Induced Mood Disorder:  Started Abilify 5 mg daily  Started Zoloft 25 mg to 50 mg daily ( titration in 2 days) Initiated Hydroxyzine 25 mg po QHS    Patient to follow-up in 6 weeks -Continue to reach out  to substance abuse programs  Sandra Rack, NP 8/29/20229:14 AM

## 2020-09-27 ENCOUNTER — Ambulatory Visit (HOSPITAL_COMMUNITY): Payer: Medicaid Other | Admitting: Licensed Clinical Social Worker

## 2020-10-22 ENCOUNTER — Other Ambulatory Visit: Payer: Self-pay

## 2020-10-22 ENCOUNTER — Telehealth (HOSPITAL_COMMUNITY): Payer: Medicaid Other

## 2021-05-27 ENCOUNTER — Other Ambulatory Visit: Payer: Self-pay

## 2021-05-27 ENCOUNTER — Emergency Department (HOSPITAL_COMMUNITY)
Admission: EM | Admit: 2021-05-27 | Discharge: 2021-05-28 | Disposition: A | Discharge status: Home / Self Care | Payer: Medicaid Other | Attending: Emergency Medicine | Admitting: Emergency Medicine

## 2021-05-27 ENCOUNTER — Encounter (HOSPITAL_COMMUNITY): Payer: Self-pay | Admitting: Emergency Medicine

## 2021-05-27 DIAGNOSIS — R45851 Suicidal ideations: Secondary | ICD-10-CM | POA: Diagnosis not present

## 2021-05-27 DIAGNOSIS — F1994 Other psychoactive substance use, unspecified with psychoactive substance-induced mood disorder: Secondary | ICD-10-CM | POA: Diagnosis present

## 2021-05-27 DIAGNOSIS — F22 Delusional disorders: Secondary | ICD-10-CM | POA: Insufficient documentation

## 2021-05-27 DIAGNOSIS — Z1330 Encounter for screening examination for mental health and behavioral disorders, unspecified: Secondary | ICD-10-CM | POA: Insufficient documentation

## 2021-05-27 DIAGNOSIS — F29 Unspecified psychosis not due to a substance or known physiological condition: Secondary | ICD-10-CM | POA: Diagnosis not present

## 2021-05-27 DIAGNOSIS — F23 Brief psychotic disorder: Secondary | ICD-10-CM

## 2021-05-27 DIAGNOSIS — Z79899 Other long term (current) drug therapy: Secondary | ICD-10-CM | POA: Insufficient documentation

## 2021-05-27 DIAGNOSIS — Z20822 Contact with and (suspected) exposure to covid-19: Secondary | ICD-10-CM | POA: Insufficient documentation

## 2021-05-27 LAB — COMPREHENSIVE METABOLIC PANEL
ALT: 16 U/L (ref 0–44)
AST: 23 U/L (ref 15–41)
Albumin: 4.8 g/dL (ref 3.5–5.0)
Alkaline Phosphatase: 95 U/L (ref 38–126)
Anion gap: 14 (ref 5–15)
BUN: 10 mg/dL (ref 6–20)
CO2: 20 mmol/L — ABNORMAL LOW (ref 22–32)
Calcium: 10.4 mg/dL — ABNORMAL HIGH (ref 8.9–10.3)
Chloride: 107 mmol/L (ref 98–111)
Creatinine, Ser: 0.92 mg/dL (ref 0.44–1.00)
GFR, Estimated: >60 mL/min (ref >60)
Glucose, Bld: 95 mg/dL (ref 70–99)
Potassium: 3.3 mmol/L — ABNORMAL LOW (ref 3.5–5.1)
Sodium: 141 mmol/L (ref 135–145)
Total Bilirubin: 0.9 mg/dL (ref 0.3–1.2)
Total Protein: 8.5 g/dL — ABNORMAL HIGH (ref 6.5–8.1)

## 2021-05-27 LAB — RESP PANEL BY RT-PCR (FLU A&B, COVID) ARPGX2
Influenza A by PCR: NEGATIVE
Influenza B by PCR: NEGATIVE
SARS Coronavirus 2 by RT PCR: NEGATIVE

## 2021-05-27 LAB — CBC
HCT: 45.8 % (ref 36.0–46.0)
Hemoglobin: 15.9 g/dL — ABNORMAL HIGH (ref 12.0–15.0)
MCH: 27.3 pg (ref 26.0–34.0)
MCHC: 34.7 g/dL (ref 30.0–36.0)
MCV: 78.6 fL — ABNORMAL LOW (ref 80.0–100.0)
Platelets: 339 10*3/uL (ref 150–400)
RBC: 5.83 MIL/uL — ABNORMAL HIGH (ref 3.87–5.11)
RDW: 13.7 % (ref 11.5–15.5)
WBC: 8.1 10*3/uL (ref 4.0–10.5)
nRBC: 0 % (ref 0.0–0.2)

## 2021-05-27 LAB — I-STAT BETA HCG BLOOD, ED (MC, WL, AP ONLY): I-stat hCG, quantitative: <5 m[IU]/mL (ref <5)

## 2021-05-27 LAB — ETHANOL: Alcohol, Ethyl (B): <10 mg/dL (ref <10)

## 2021-05-27 MED ORDER — HYDROXYZINE HCL 25 MG PO TABS
25.0000 mg | ORAL_TABLET | Freq: Every evening | ORAL | Status: DC | PRN
  Administered 2021-05-27: 25 mg via ORAL
  Filled 2021-05-27: qty 1

## 2021-05-27 MED ORDER — NICOTINE 21 MG/24HR TD PT24
21.0000 mg | MEDICATED_PATCH | Freq: Every day | TRANSDERMAL | Status: DC
  Administered 2021-05-27 – 2021-05-28 (×2): 21 mg via TRANSDERMAL
  Filled 2021-05-27 (×2): qty 1

## 2021-05-27 MED ORDER — LORAZEPAM 2 MG/ML IJ SOLN: 2.0000 mg | Freq: Once | INTRAMUSCULAR | Status: DC

## 2021-05-27 MED ORDER — TRAZODONE HCL 50 MG PO TABS
50.0000 mg | ORAL_TABLET | Freq: Every day | ORAL | Status: DC
  Administered 2021-05-27: 50 mg via ORAL
  Filled 2021-05-27: qty 1

## 2021-05-27 MED ORDER — HYDROCHLOROTHIAZIDE 12.5 MG PO TABS
25.0000 mg | ORAL_TABLET | Freq: Every day | ORAL | Status: DC
  Administered 2021-05-27 – 2021-05-28 (×2): 25 mg via ORAL
  Filled 2021-05-27 (×3): qty 2

## 2021-05-27 MED ORDER — ZIPRASIDONE MESYLATE 20 MG IM SOLR
20.0000 mg | Freq: Once | INTRAMUSCULAR | Status: AC
  Administered 2021-05-27: 20 mg via INTRAMUSCULAR
  Filled 2021-05-27: qty 20

## 2021-05-27 MED ORDER — SERTRALINE HCL 50 MG PO TABS
25.0000 mg | ORAL_TABLET | Freq: Every day | ORAL | Status: DC
  Administered 2021-05-27 – 2021-05-28 (×2): 25 mg via ORAL
  Filled 2021-05-27 (×2): qty 1

## 2021-05-27 MED ORDER — ARIPIPRAZOLE 5 MG PO TABS
5.0000 mg | ORAL_TABLET | Freq: Every day | ORAL | Status: DC
  Administered 2021-05-27 – 2021-05-28 (×2): 5 mg via ORAL
  Filled 2021-05-27 (×2): qty 1

## 2021-05-27 NOTE — Consult Note (Signed)
[facility] Face-to-Face Psychiatry Consult  ? ?Reason for Consult:  Psychiatric Evaluation ?Referring Physician:  ER Physician ?Patient Identification: Sandra Lee ?MRN:  [id number] ?Principal Diagnosis: Substance induced mood disorder (HCC) ?Diagnosis:  Principal Problem: ?  Substance induced mood disorder (HCC) ? ? ?Total Time spent with patient: 20 minutes ? ?Subjective:   ?Sandra Lee is a 36 y.o. female patient admitted with hx of .Substance abuse, Bipolar disorder, Depression.  Patient was seen at [facility] last August and was supposed to be seeing a Therapist, sports at [facility].  She is not quite sure if [facility] her outpatient provider. ? ?HPI:  Substance abuse, Bipolar disorder, Depression.  Patient was seen at [facility] last August and was supposed to be seeing a Therapist, sports at [facility] is her outpatient provider..  She is not quite sure if [facility].  Patient was brought in to the ER by her mother after she had endorses Suicide ideation and paranoia regarding a bill. Initially she willingly came in but on arrival became agitated, belligerent and abusive.  She started yelling when she realized that she was made IVC.  Patient was totally out of control.  She was only keeping her pant but no scrub top.  She threw her dinner all over the floor.  She was given Geodon 20 mg injection but did not make her sleep but she was able to calm down after an hour .  She remains tearful.  She reported taking Cocaine and Marijuana last Saturday night/early Sunday.  She reported taking both drugs daily.  Patient reported not taking her Psychotropics since August.  She stated that some times after using drugs she take I/3 of her prescribed Seroquel she has at home.  Patient does not know the facility she usually goes for mental health care, does not know the name of her Psychiatrist.  She was at [facility] once in August last year.  She reported poor sleep and appetite.  She is unemployed at this time and has a son who is  staying with her grandmother. ?We will monitor over night and reevaluate in the morning.  We will offer Aripiprazole 5 mg daily with plan to increase.  We have prescribed her Trazodone for sleep as she reported no sleep in days.  She denied SIHI at this time. ? ?Past Psychiatric History: Substance abuse, Bipolar disorder, Depression.  Patient was seen at [facility] last August and was supposed to be seeing a Therapist, sports at [employer].  She is not quite sure if [facility] ? ?Risk to Self:   ?Risk to Others:   ?Prior Inpatient Therapy:   ?Prior Outpatient Therapy:   ? ?Past Medical History:  ?Past Medical History:  ?Diagnosis Date  ? Abnormal Pap smear   ? Depression   ? hx of depression - not taking medication  ? Heart murmur 36 yrs old  ? not sure if it is still present.  ? PIH (pregnancy induced hypertension), antepartum 11/19/2010  ? History reviewed. No pertinent surgical history. ?Family History:  ?Family History  ?Problem Relation Age of Onset  ? Depression Mother   ? Early death Father   ?     HIV  ? Arthritis Sister   ? Diabetes Maternal Aunt   ? Early death Maternal Aunt   ? Alcohol abuse Maternal Uncle   ? Diabetes Maternal Uncle   ? Diabetes Paternal Aunt   ? Diabetes Paternal Uncle   ? Arthritis Maternal Grandmother   ? Diabetes Maternal Grandmother   ?  Heart disease Maternal Grandmother   ? Stroke Maternal Grandmother   ? Diabetes Maternal Grandfather   ? ?Family Psychiatric  History: unknown ?Social History:  ?Social History  ? ?Substance and Sexual Activity  ?Alcohol Use No  ?   ?Social History  ? ?Substance and Sexual Activity  ?Drug Use Not Currently  ? Types: Cocaine  ?  ?Social History  ? ?Socioeconomic History  ? Marital status: Single  ?  Spouse name: Not on file  ? Number of children: Not on file  ? Years of education: Not on file  ? Highest education level: Not on file  ?Occupational History  ? Not on file  ?Tobacco Use  ? Smoking status: Every Day  ?  Packs/day: 0.50  ?  Types: Cigarettes  ? Smokeless  tobacco: Never  ?Vaping Use  ? Vaping Use: Never used  ?Substance and Sexual Activity  ? Alcohol use: No  ? Drug use: Not Currently  ?  Types: Cocaine  ? Sexual activity: Yes  ?  Birth control/protection: Implant  ?Other Topics Concern  ? Not on file  ?Social History Narrative  ? Not on file  ? ?Social Determinants of Health  ? ?Financial Resource Strain: Not on file  ?Food Insecurity: Not on file  ?Transportation Needs: Not on file  ?Physical Activity: Not on file  ?Stress: Not on file  ?Social Connections: Not on file  ? ?Additional Social History: ?  ? ?Allergies:   ?Allergies  ?Allergen Reactions  ? Penicillins Other (See Comments)  ?  Childhood reaction patient unsure of allergic response  ? ? ?Labs:  ?Results for orders placed or performed during the hospital encounter of 05/27/21 (from the past 48 hour(s))  ?Comprehensive metabolic panel     Status: Abnormal  ? Collection Time: 05/27/21  1:59 PM  ?Result Value Ref Range  ? Sodium 141 135 - 145 mmol/L  ? Potassium 3.3 (L) 3.5 - 5.1 mmol/L  ? Chloride 107 98 - 111 mmol/L  ? CO2 20 (L) 22 - 32 mmol/L  ? Glucose, Bld 95 70 - 99 mg/dL  ?  Comment: Glucose reference range applies only to samples taken after fasting for at least 8 hours.  ? BUN 10 6 - 20 mg/dL  ? Creatinine, Ser 0.92 0.44 - 1.00 mg/dL  ? Calcium 10.4 (H) 8.9 - 10.3 mg/dL  ? Total Protein 8.5 (H) 6.5 - 8.1 g/dL  ? Albumin 4.8 3.5 - 5.0 g/dL  ? AST 23 15 - 41 U/L  ? ALT 16 0 - 44 U/L  ? Alkaline Phosphatase 95 38 - 126 U/L  ? Total Bilirubin 0.9 0.3 - 1.2 mg/dL  ? GFR, Estimated >60 >60 mL/min  ?  Comment: (NOTE) ?Calculated using the CKD-EPI Creatinine Equation (2021) ?  ? Anion gap 14 5 - 15  ?  Comment: Performed at [facility], [address] [place], [place] [address]  ?cbc     Status: Abnormal  ? Collection Time: 05/27/21  1:59 PM  ?Result Value Ref Range  ? WBC 8.1 4.0 - 10.5 K/uL  ? RBC 5.83 (H) 3.87 - 5.11 MIL/uL  ? Hemoglobin 15.9 (H) 12.0 - 15.0 g/dL  ? HCT 45.8 36.0 -  46.0 %  ? MCV 78.6 (L) 80.0 - 100.0 fL  ? MCH 27.3 26.0 - 34.0 pg  ? MCHC 34.7 30.0 - 36.0 g/dL  ? RDW 13.7 11.5 - 15.5 %  ? Platelets 339 150 - 400 K/uL  ?  nRBC 0.0 0.0 - 0.2 %  ?  Comment: Performed at [facility], [address], [place], [place] [address]  ?Ethanol     Status: None  ? Collection Time: 05/27/21  2:30 PM  ?Result Value Ref Range  ? Alcohol, Ethyl (B) <10 <10 mg/dL  ?  Comment: (NOTE) ?Lowest detectable limit for serum alcohol is 10 mg/dL. ? ?For medical purposes only. ?Performed at [facility], [address] ?[place], [place] [address] ?  ?I-Stat beta hCG blood, ED     Status: None  ? Collection Time: 05/27/21  3:10 PM  ?Result Value Ref Range  ? I-stat hCG, quantitative <5.0 <5 mIU/mL  ? Comment 3          ?  Comment:   GEST. AGE      CONC.  (mIU/mL) ?  <=1 WEEK        5 - 50 ?    2 WEEKS       50 - 500 ?    3 WEEKS       100 - 10,000 ?    4 WEEKS     1,000 - 30,000 ?       ?FEMALE AND NON-PREGNANT FEMALE: ?    LESS THAN 5 mIU/mL ?  ? ? ?Current Facility-Administered Medications  ?Medication Dose Route Frequency Provider Last Rate Last Admin  ? ARIPiprazole (ABILIFY) tablet 5 mg  5 mg Oral Daily Gerhard Munch, MD   5 mg at 05/27/21 1846  ? hydrochlorothiazide (HYDRODIURIL) tablet 25 mg  25 mg Oral Daily Gerhard Munch, MD   25 mg at 05/27/21 1843  ? hydrOXYzine (ATARAX) tablet 25 mg  25 mg Oral QHS PRN Gerhard Munch, MD      ? LORazepam (ATIVAN) injection 2 mg  2 mg Intramuscular Once Gerhard Munch, MD      ? sertraline (ZOLOFT) tablet 25 mg  25 mg Oral Daily Gerhard Munch, MD   25 mg at 05/27/21 1843  ? traZODone (DESYREL) tablet 50 mg  50 mg Oral QHS Earney Navy, NP      ? ?Current Outpatient Medications  ?Medication Sig Dispense Refill  ? ARIPiprazole (ABILIFY) 5 MG tablet Take 1 tablet (5 mg total) by mouth daily. 30 tablet 2  ? calcium carbonate (OSCAL) 1500 (600 Ca) MG TABS tablet Take 1 tablet by mouth in the morning and at  bedtime.    ? etonogestrel (NEXPLANON) 68 MG IMPL implant 68 mg by Subdermal route once.     ? folic acid (FOLVITE) 1 MG tablet Take 1 mg by mouth daily.    ? hydrochlorothiazide (HYDRODIURIL) 25 MG tablet Take

## 2021-05-27 NOTE — Progress Notes (Signed)
05/27/2021  1445  Restraints were not applied. ?

## 2021-05-27 NOTE — ED Triage Notes (Signed)
Pt is here with mom for psychiatric evaluation. Reports SI. Paranoid about housing bills, etc. Pt agitated for this RN asking triage questions.  ?

## 2021-05-27 NOTE — ED Provider Notes (Signed)
?[facility]-EMERGENCY DEPT ?Provider Note ? ? ?CSN: [id number] ?Arrival date & time: 05/27/21  1316 ? ?  ? ?History ? ?Chief Complaint  ?Patient presents with  ? Psychiatric Evaluation  ? ? ?Sandra Lee is a 36 y.o. female. ? ?HPI ?Patient presents with her mother who provides the history.  Patient was reportedly calm on arrival, but came very agitated after being in the emergency department.  Patient is yelling, screaming, states that people are trying to get her.  Mother reports the patient was suicidal, and did volunteer that she wants to get help.  History, per chart review notable for substance abuse mood disorder.  Level 5 caveat secondary to psychiatric disease ?  ? ?Home Medications ?Prior to Admission medications   ?Medication Sig Start Date End Date Taking? Authorizing Provider  ?ARIPiprazole (ABILIFY) 5 MG tablet Take 1 tablet (5 mg total) by mouth daily. 09/10/20   [name], NP  ?calcium carbonate (OSCAL) 1500 (600 Ca) MG TABS tablet Take 1 tablet by mouth in the morning and at bedtime.    [provider]  ?etonogestrel (NEXPLANON) 68 MG IMPL implant 68 mg by Subdermal route once.     [provider]  ?folic acid (FOLVITE) 1 MG tablet Take 1 mg by mouth daily. 01/19/19   [provider]  ?hydrochlorothiazide (HYDRODIURIL) 25 MG tablet Take 25 mg by mouth daily. 01/19/19   [provider]  ?hydrOXYzine (ATARAX/VISTARIL) 25 MG tablet Take 1 tablet (25 mg total) by mouth at bedtime as needed. 09/10/20   [name], NP  ?methotrexate 50 MG/2ML injection Inject 25 mg into the skin every Wednesday. 07/27/20   [provider]  ?Multiple Vitamin (MULTIVITAMIN WITH MINERALS) TABS tablet Take 1 tablet by mouth daily.    [provider]  ?mupirocin ointment (BACTROBAN) 2 % Place 1 application into the nose in the morning and at bedtime. Place 1 inch strip in saline mixture and shake well to mix, rinse bilateral nasal cavities. 06/04/20    [provider]  ?predniSONE (DELTASONE) 5 MG tablet Take 10 mg by mouth daily.    [provider]  ?sertraline (ZOLOFT) 25 MG tablet Take 1 tablet (25 mg total) by mouth daily. 09/10/20 09/10/21  [name], NP  ?sodium chloride (OCEAN) 0.65 % nasal spray Place 2 sprays into the nose daily as needed for congestion. 03/06/20   [provider]  ?   ? ?Allergies    ?Penicillins   ? ?Review of Systems   ?Review of Systems  ?Unable to perform ROS: Psychiatric disorder  ? ?Physical Exam ?Updated Vital Signs ?BP (!) 194/136 (BP Location: Left Arm)   Pulse 80   Temp 99.5 ?F (37.5 ?C) (Oral)   Resp 20   SpO2 100%  ?Physical Exam ?Vitals and nursing note reviewed.  ?Constitutional:   ?   General: She is in acute distress.  ?   Appearance: She is well-developed. She is diaphoretic.  ?   Comments: Thin adult female agitated, walking about, combative, threatening, not responding to normal stimuli  ?HENT:  ?   Head: Normocephalic and atraumatic.  ?Eyes:  ?   Conjunctiva/sclera: Conjunctivae normal.  ?Cardiovascular:  ?   Rate and Rhythm: Normal rate and regular rhythm.  ?Pulmonary:  ?   Effort: Pulmonary effort is normal. No respiratory distress.  ?   Breath sounds: Normal breath sounds. No stridor.  ?Abdominal:  ?   General: There is no distension.  ?Skin: ?  General: Skin is warm.  ?Neurological:  ?   Mental Status: She is alert.  ?   Cranial Nerves: No cranial nerve deficit.  ?   Comments: Ambulatory, no obvious neurologic deficits  ?Psychiatric:     ?   Mood and Affect: Affect is labile.     ?   Speech: Speech is rapid and pressured.     ?   Behavior: Behavior is agitated, aggressive and hyperactive.     ?   Thought Content: Thought content is paranoid and delusional.     ?   Cognition and Memory: Cognition is impaired.  ? ? ?ED Results / Procedures / Treatments   ?Labs ?(all labs ordered are listed, but only abnormal results are displayed) ?Labs Reviewed  ?COMPREHENSIVE METABOLIC PANEL   ?ETHANOL  ?CBC  ?RAPID URINE DRUG SCREEN, HOSP PERFORMED  ?I-STAT BETA HCG BLOOD, ED ([facility], [facility], [facility] ONLY)  ? ? ?EKG ?None ? ?Radiology ?No results found. ? ?Procedures ?Procedures  ? ? ?Medications Ordered in ED ?Medications  ?ziprasidone (GEODON) injection 20 mg (has no administration in time range)  ? ? ?ED Course/ Medical Decision Making/ A&P ?This patient with a Hx of substance abuse mood disorder presents to the ED for concern of acute agitation, psychosis after reportedly describing suicidal ideation to her mother, this involves an extensive number of treatment options, and is a complaint that carries with it a high risk of complications and morbidity.   ? ?The differential diagnosis includes acute psychosis, psychosis secondary to substance abuse, suicidal ideation ? ? ?Social Determinants of Health: ? ?Substance abuse, psychiatric disease ? ?Additional history obtained: ? ?Additional history and/or information obtained from mother, notable for details of HPI as above ? ? ?After the initial evaluation, orders, including: Labs, and notably patient required restraints, chemical sedation with Geodon were initiated. ? ? ? ? ?On repeat evaluation of the patient improved ? ?Lab Tests: ? ?I personally interpreted labs.  The pertinent results include: Mild hypokalemia otherwise unremarkable labs, no pregnancy ? ?Consultations Obtained: ? ?I requested consultation with the behavioral health,  and patient will be evaluated, she is medically cleared for this ? ?Dispostion / Final MDM: ? ?After consideration of the diagnostic results and the patient's response to treatment, adult female with history of reported mood induced substance disorder presents with agitation, violence, in the context of being brought here after describing suicidal ideation to her mother.  Patient required sedation, but was eventually medically cleared for behavioral health evaluation. ? ?Final Clinical Impression(s) / ED Diagnoses ?Final diagnoses:   ?Suicidal ideation  ?Acute psychosis (HCC)  ? ?  ?Gerhard Munch, MD ?05/27/21 1625 ? ?

## 2021-05-27 NOTE — ED Notes (Signed)
Provider at bedside

## 2021-05-27 NOTE — Consult Note (Signed)
Attempt to assess patient failed as she is uncooperative, angry and yelling.  She received Geodon 10 minutes before Provider arrived. ? ?

## 2021-05-28 DIAGNOSIS — F1994 Other psychoactive substance use, unspecified with psychoactive substance-induced mood disorder: Secondary | ICD-10-CM

## 2021-05-28 MED ORDER — HYDROCHLOROTHIAZIDE 25 MG PO TABS
25.0000 mg | ORAL_TABLET | Freq: Once | ORAL | Status: AC
  Administered 2021-05-28: 25 mg via ORAL
  Filled 2021-05-28: qty 1

## 2021-05-28 MED ORDER — TRAZODONE HCL 50 MG PO TABS: 50.0000 mg | ORAL_TABLET | Freq: Every day | ORAL | 0 refills | Status: DC

## 2021-05-28 MED ORDER — NICOTINE 21 MG/24HR TD PT24: 21.0000 mg | MEDICATED_PATCH | Freq: Every day | TRANSDERMAL | 0 refills | Status: DC

## 2021-05-28 MED ORDER — MELATONIN 3 MG PO TABS
3.0000 mg | ORAL_TABLET | Freq: Every day | ORAL | Status: DC
  Administered 2021-05-28: 3 mg via ORAL
  Filled 2021-05-28: qty 1

## 2021-05-28 NOTE — ED Notes (Signed)
Patient alert this shift. Cooperative. Medication compliant. Patient denied suicidal ideation and homicidal ideation.  Patient  states, "feeling better emotionally".  ?

## 2021-05-28 NOTE — Discharge Instructions (Signed)
Follow-up as instructed by behavioral health and follow-up within the next week with the primary care doctor to evaluate your blood pressure further ?

## 2021-05-28 NOTE — Consult Note (Addendum)
[facility] ? ?05/28/2021 1:09 PM ?Sandra LatinaAtisha K Lee  ?MRN:  [id number] ? ?Method of visit?: Face to Face  ? ?Principal Problem: Substance induced mood disorder (HCC) ?Discharge Diagnoses: Principal Problem: ?  Substance induced mood disorder (HCC) ? ? ?Subjective: Sandra Lee is a 36 y.o. female patient admitted with hx of .Substance abuse, Bipolar disorder, Depression.  [facility] August and was supposed to be seeing a Therapist, sportssychiatrist at [facility].  She is not quite sure if [facility].  Patient was seen this afternoon calm, cooperative, alert and oriented x5.  She is apologetic to staff members for her behavior yesterday.  She understands that using Cocaine and Marijuana is not the best for her.  She also denied feeling suicidal or homicidal stating she has a 36 years old son to take care of.  Patient and provider discussed the dangers of using illicit drugs in the brain and over all body system.  We discussed the need to get back on her Psychotropic medications and [facility] is [facility] to seek [facility] health care and substance abuse care as well.  She denied SI/HI/AVH and is not paranoid.  Patient is discharged.  Hydrochlorothiazide is renewed to be picked up at her Pharmacy  this afternoon. ? ?Total Time spent with patient: 20 minutes ? ?Past Psychiatric History: see initial Psychiatry Consult Note ? ?Past Medical History:  ?Past Medical History:  ?Diagnosis Date  ? Abnormal Pap smear   ? Depression   ? hx of depression - not taking medication  ? Heart murmur 36 yrs old  ? not sure if it is still present.  ? PIH (pregnancy induced hypertension), antepartum 11/19/2010  ? History reviewed. No pertinent surgical history. ?Family History:  ?Family History  ?Problem Relation Age of Onset  ? Depression Mother   ? Early death Father   ?     HIV  ? Arthritis Sister   ? Diabetes Maternal Aunt   ? Early death Maternal Aunt   ? Alcohol abuse Maternal Uncle   ? Diabetes  Maternal Uncle   ? Diabetes Paternal Aunt   ? Diabetes Paternal Uncle   ? Arthritis Maternal Grandmother   ? Diabetes Maternal Grandmother   ? Heart disease Maternal Grandmother   ? Stroke Maternal Grandmother   ? Diabetes Maternal Grandfather   ? ?Family Psychiatric  History: see initial Psychiatry note ?Social History:  ?Social History  ? ?Substance and Sexual Activity  ?Alcohol Use No  ?   ?Social History  ? ?Substance and Sexual Activity  ?Drug Use Not Currently  ? Types: Cocaine  ?  ?Social History  ? ?Socioeconomic History  ? Marital status: Single  ?  Spouse name: Not on file  ? Number of children: Not on file  ? Years of education: Not on file  ? Highest education level: Not on file  ?Occupational History  ? Not on file  ?Tobacco Use  ? Smoking status: Every Day  ?  Packs/day: 0.50  ?  Types: Cigarettes  ? Smokeless tobacco: Never  ?Vaping Use  ? Vaping Use: Never used  ?Substance and Sexual Activity  ? Alcohol use: No  ? Drug use: Not Currently  ?  Types: Cocaine  ? Sexual activity: Yes  ?  Birth control/protection: Implant  ?Other Topics Concern  ? Not on file  ?Social History Narrative  ? Not on file  ? ?Social Determinants of Health  ? ?Financial  Resource Strain: Not on file  ?Food Insecurity: Not on file  ?Transportation Needs: Not on file  ?Physical Activity: Not on file  ?Stress: Not on file  ?Social Connections: Not on file  ? ? ?Tobacco Cessation:  A prescription for an FDA-approved tobacco cessation medication provided at discharge ? ?Current Medications: ?Current Facility-Administered Medications  ?Medication Dose Route Frequency Provider Last Rate Last Admin  ? ARIPiprazole (ABILIFY) tablet 5 mg  5 mg Oral Daily Gerhard Munch, MD   5 mg at 05/28/21 4166  ? hydrochlorothiazide (HYDRODIURIL) tablet 25 mg  25 mg Oral Daily Gerhard Munch, MD   25 mg at 05/28/21 0630  ? hydrochlorothiazide (HYDRODIURIL) tablet 25 mg  25 mg Oral Once Dahlia Byes C, NP      ? hydrOXYzine (ATARAX) tablet 25  mg  25 mg Oral QHS PRN Gerhard Munch, MD   25 mg at 05/27/21 2054  ? LORazepam (ATIVAN) injection 2 mg  2 mg Intramuscular Once Gerhard Munch, MD      ? melatonin tablet 3 mg  3 mg Oral QHS Sabas Sous, MD   3 mg at 05/28/21 0126  ? nicotine (NICODERM CQ - dosed in mg/24 hours) patch 21 mg  21 mg Transdermal Daily Dahlia Byes C, NP   21 mg at 05/28/21 0920  ? sertraline (ZOLOFT) tablet 25 mg  25 mg Oral Daily Gerhard Munch, MD   25 mg at 05/28/21 1601  ? traZODone (DESYREL) tablet 50 mg  50 mg Oral QHS Dahlia Byes C, NP   50 mg at 05/27/21 2054  ? ?Current Outpatient Medications  ?Medication Sig Dispense Refill  ? ARIPiprazole (ABILIFY) 5 MG tablet Take 1 tablet (5 mg total) by mouth daily. 30 tablet 2  ? etonogestrel (NEXPLANON) 68 MG IMPL implant 68 mg by Subdermal route once.     ? hydrochlorothiazide (HYDRODIURIL) 25 MG tablet Take 25 mg by mouth daily.    ? methotrexate 50 MG/2ML injection Inject 25 mg into the skin every Wednesday.    ? sertraline (ZOLOFT) 25 MG tablet Take 1 tablet (25 mg total) by mouth daily. 30 tablet 2  ? calcium carbonate (OSCAL) 1500 (600 Ca) MG TABS tablet Take 1 tablet by mouth in the morning and at bedtime.    ? [START ON 05/29/2021] nicotine (NICODERM CQ - DOSED IN MG/24 HOURS) 21 mg/24hr patch Place 1 patch (21 mg total) onto the skin daily. 28 patch 0  ? traZODone (DESYREL) 50 MG tablet Take 1 tablet (50 mg total) by mouth at bedtime. 30 tablet 0  ? ?PTA Medications: ?(Not in a hospital admission) ? ? ?Musculoskeletal: ?Strength & Muscle Tone: within normal limits ?Gait & Station: normal ?Patient leans: Front ? ?Psychiatric Specialty Exam: ? ?Presentation  ?General Appearance: Appropriate for Environment; Fairly Groomed ? ?Eye Contact:Good ? ?Speech:Clear and Coherent; Normal Rate ? ?Speech Volume:Normal ? ?Handedness:Right ? ? ?Mood and Affect  ?Mood:Depressed ? ?Affect:Congruent ? ? ?Thought Process  ?Thought Processes:Coherent; Goal  Directed ? ?Descriptions of Associations:Intact ? ?Orientation:Full (Time, Place and Person) ? ?Thought Content:Logical ? ?History of Schizophrenia/Schizoaffective disorder:No ? ?Duration of Psychotic Symptoms:Greater than six months ? ?Hallucinations:Hallucinations: None ?Description of Auditory Hallucinations: Not sure of what she is hearing.  Too much voices ?Description of Visual Hallucinations: Unable to explain what she has been seeing ? ?Ideas of Reference:None ? ?Suicidal Thoughts:Suicidal Thoughts: No ?SI Active Intent and/or Plan: Without Plan ? ?Homicidal Thoughts:Homicidal Thoughts: No ? ? ?Sensorium  ?Memory:Immediate Good; Recent Good; Remote Fair ? ?  Judgment:Good ? ?Insight:Good ? ? ?Executive Functions  ?Concentration:Good ? ?Attention Span:Good ? ?Recall:Good ? ?Fund of Knowledge:Good ? ?Language:Good ? ? ?Psychomotor Activity  ?Psychomotor Activity:Psychomotor Activity: Normal ? ? ?Assets  ?Assets:Communication Skills; Desire for Improvement; Housing; Social Support; Physical Health ? ? ?Sleep  ?Sleep:Sleep: Fair ? ? ? ?Physical Exam: ?Physical Exam ?Vitals and nursing note reviewed.  ?Constitutional:   ?   Appearance: Normal appearance.  ?HENT:  ?   Head: Normocephalic and atraumatic.  ?   Nose: Nose normal.  ?Cardiovascular:  ?   Rate and Rhythm: Normal rate.  ?Pulmonary:  ?   Effort: Pulmonary effort is normal.  ?Musculoskeletal:     ?   General: Normal range of motion.  ?   Cervical back: Normal range of motion.  ?Skin: ?   General: Skin is warm and dry.  ?Neurological:  ?   General: No focal deficit present.  ?   Mental Status: She is alert and oriented to person, place, and time.  ? ?Review of Systems  ?Constitutional: Negative.   ?HENT: Negative.    ?Eyes: Negative.   ?Respiratory: Negative.    ?Cardiovascular: Negative.   ?Gastrointestinal: Negative.   ?Genitourinary: Negative.   ?Musculoskeletal: Negative.   ?Skin: Negative.   ?Neurological: Negative.   ?Endo/Heme/Allergies: Negative.    ?Psychiatric/Behavioral:  Positive for substance abuse. The patient has insomnia.   ?Blood pressure (!) 206/117, pulse 87, temperature 98.3 ?F (36.8 ?C), temperature source Oral, resp. rate 18, SpO2 100 %. There is no height

## 2021-05-28 NOTE — ED Notes (Signed)
Patient DC d off unit to home per provider. Patient alert and cooperative, no s/s of distress. DC information given to and reviewed with patient, with acknowledged understanding .  Patient ambulatory off unit, escorted by NT. Patient transported by family ?

## 2021-06-03 ENCOUNTER — Encounter: Payer: Self-pay | Admitting: Student

## 2021-06-03 ENCOUNTER — Ambulatory Visit (INDEPENDENT_AMBULATORY_CARE_PROVIDER_SITE_OTHER): Payer: Medicaid Other | Admitting: Student

## 2021-06-03 ENCOUNTER — Other Ambulatory Visit: Payer: Self-pay | Admitting: Student

## 2021-06-03 VITALS — BP 167/108 | HR 82 | Ht 65.0 in | Wt 139.0 lb

## 2021-06-03 DIAGNOSIS — Z7689 Persons encountering health services in other specified circumstances: Secondary | ICD-10-CM | POA: Diagnosis present

## 2021-06-03 MED ORDER — HYDROCHLOROTHIAZIDE 25 MG PO TABS: 25.0000 mg | ORAL_TABLET | Freq: Every day | ORAL | 0 refills | Status: DC

## 2021-06-03 NOTE — Patient Instructions (Addendum)
It was great to see you! Thank you for allowing me to participate in your care!  I recommend that you always bring your medications to each appointment as this makes it easy to ensure we are on the correct medications and helps [place] not miss when refills are needed.  Our plans for today:  - Follow up Thursday morning - Reach out to [facility] for med refill   Take care and seek immediate care sooner if you develop any concerns.   Dr. Holley Bouche, MD [place]

## 2021-06-03 NOTE — Progress Notes (Cosign Needed Addendum)
  SUBJECTIVE:   CHIEF COMPLAINT / HPI:   PMH: Wegners HTN Sickle cell trait Short term memory loss Bipolar disorder Anxiety  Pap Smear last year normal  PSrgHx: Nasal biopsy for Wegners disease Repeat visits to have nose drained  Medications: HCTZ - 25 mg daily Hydroxyzine 25 mg daily Zoloft - 25 mg daily Folic Acid - 1 mg daily   Supposed to be on  Methotrexate 50 mg , weekly Rituxan twice every 6 months  Abilify 5 mg Calcium  Carbonate 1500   Family Hx: Diabetes HTN Depression MI in grandparents, aunts CVA grandparents and aunts COPD in grandmother who never smoked Liver issues in grandmother   Social: Work: Mental health keeps her from working (can't focus/ remember well, being depressed), Wegners disease (discharge in throat and makes breath bad, painful, sinus issues) Partner:  3-4 years sexually active, using condoms Child: 28 yo, Jolyne Loa, female Drugs:  Tobacco: 1 PPD, since age of 66, 20 years. Interested in quitting EtOH: 1-2 beer a day, 2-3 days a week.  Rec Drugs:  Cocaine - 15-20 years, every other day or so, all day. Snorting.  THC - smoking 2 marijuanna ciagarrets in a day.  Sex -  Sexually active, same partner for last 3-4 years.  PERTINENT  PMH / PSH: Depression, substance use   No past surgical history on file.  OBJECTIVE:  BP (!) 167/108   Pulse 82   Ht 5\' 5"  (1.651 m)   Wt 139 lb (63 kg)   SpO2 100%   BMI 23.13 kg/m   General: NAD, pleasant, able to participate in exam Cardiac: RRR, no murmurs auscultated. Respiratory: CTAB, normal effort, no wheezes, rales or rhonchi Abdomen: soft, non-tender, non-distended, normoactive bowel sounds Extremities: warm and well perfused, no edema or cyanosis. Skin: warm and dry, no rashes noted Neuro: alert, no obvious focal deficits, speech normal Psych: Normal affect and mood  ASSESSMENT/PLAN:   New Patient Encounter Patient here to establish care. History discussed and med's  refilled.  -Psych meds: Abilify, Zoloft, to be managed by [facility] psychiatry. Encouraged patient to follow up with [facility]. Patient with notable PHQ-9 on -Follow up visit Patient with follow up appointment on Thursday to discuss chronic problems, PHQ-9, BP, and vaginal discharge  Depression: Patient to be seen this week Thursday to follow up her reaching out to Monday. PHQ-9 25 today, but she denies any SI today, but notes she had some issues in the past. Will follow up with patient on Thursday, to see if she's seen/reached out to [facility]  BP BP elevated today, patient repots she has taken her meds today. Will revaluate at visit on Thursday to determine changes to antihypertensive regimen.   No problem-specific Assessment & Plan notes found for this encounter.   No orders of the defined types were placed in this encounter.  Meds ordered this encounter  Medications   hydrochlorothiazide (HYDRODIURIL) 25 MG tablet    Sig: Take 1 tablet (25 mg total) by mouth daily.    Dispense:  30 tablet    Refill:  0   No follow-ups on file. @SIGNNOTE @

## 2021-06-06 ENCOUNTER — Other Ambulatory Visit: Payer: Self-pay | Admitting: Student

## 2021-06-06 ENCOUNTER — Encounter: Payer: Self-pay | Admitting: Student

## 2021-06-06 ENCOUNTER — Ambulatory Visit (INDEPENDENT_AMBULATORY_CARE_PROVIDER_SITE_OTHER): Payer: Medicaid Other | Admitting: Student

## 2021-06-06 ENCOUNTER — Other Ambulatory Visit: Payer: Self-pay

## 2021-06-06 ENCOUNTER — Other Ambulatory Visit (HOSPITAL_COMMUNITY)
Admission: RE | Admit: 2021-06-06 | Discharge: 2021-06-06 | Disposition: A | Discharge status: Home / Self Care | Payer: Medicaid Other | Source: Ambulatory Visit | Attending: Family Medicine | Admitting: Family Medicine

## 2021-06-06 VITALS — BP 143/101 | HR 78 | Wt 145.0 lb

## 2021-06-06 DIAGNOSIS — F319 Bipolar disorder, unspecified: Secondary | ICD-10-CM

## 2021-06-06 DIAGNOSIS — M3130 Wegener's granulomatosis without renal involvement: Secondary | ICD-10-CM | POA: Diagnosis not present

## 2021-06-06 DIAGNOSIS — I10 Essential (primary) hypertension: Secondary | ICD-10-CM | POA: Insufficient documentation

## 2021-06-06 DIAGNOSIS — N898 Other specified noninflammatory disorders of vagina: Secondary | ICD-10-CM | POA: Insufficient documentation

## 2021-06-06 DIAGNOSIS — F1410 Cocaine abuse, uncomplicated: Secondary | ICD-10-CM | POA: Diagnosis not present

## 2021-06-06 LAB — POCT WET PREP (WET MOUNT)
Clue Cells Wet Prep Whiff POC: POSITIVE
Trichomonas Wet Prep HPF POC: ABSENT

## 2021-06-06 MED ORDER — LOSARTAN POTASSIUM 25 MG PO TABS: 25.0000 mg | ORAL_TABLET | Freq: Every day | ORAL | 0 refills | Status: DC

## 2021-06-06 NOTE — Assessment & Plan Note (Signed)
Patient with Hx of HTN on HCTZ 25 mg. Reports continues to take medicine daily. Also reports daily cocaine use, would not add beta blocker to regimen. Patient had nexplanon placed last year, will try ARB. -HCTZ 25 mg daily -Losartan 25 mg daily -Follow up in 1 week

## 2021-06-06 NOTE — Assessment & Plan Note (Addendum)
Patient with history of Bipolar disorder, last followed by [facility]. She denies any active thoughts of SI/HI, but does not she has the thoughts of SI from time to time. Says that her son motivates her to keep on going, and that in a crisis she can call her mother, or pray. She was given resources for suicide hotline and therapy resources.  -Follow up with [facility] -Follow up with [facility] in 1 week -Therapy resources given -Resources for SI crisis given

## 2021-06-06 NOTE — Patient Instructions (Addendum)
It was great to see you! Thank you for allowing me to participate in your care!  I recommend that you always bring your medications to each appointment as this makes it easy to ensure we are on the correct medications and helps [place] not miss when refills are needed.  Our plans for today:   - High Blood Pressure  Starting Losartan 25 mg daily  Continue HCTZ (Hydrochlorothiazide) 25 mg daily  Follow up next week  - Vaginal Discharge  Pelvic exam, will call with results  - Substance Use  Look into local Narcotics Anonymous: Events  [employer] of Narcotics Anonymous (greensborona.org)   Google search: [facility] Narcotics Anonymous  - Depression/Bipolar  Follow up with [employer] after visit  [facility]  [address] [place], [address]  - Wegner's  Follow up with Rheumatologist  [facility] - [address]  Francee Gentile, MD    [address]   [address]   [place], [place] [address]   [phone]    We are checking some labs today, I will call you if they are abnormal will send you a MyChart message or a letter if they are normal.  If you do not hear about your labs in the next 2 weeks please let us know.  Take care and seek immediate care sooner if you develop any concerns.   Dr. Bess Kinds, MD [facility] Family Medicine   Resources for Suicidal Ideation  If you are feeling suicidal or depression symptoms worsen please immediately go to:   If you are thinking about harming yourself or having thoughts of suicide, or if you know someone who is, seek help right away. If you are in crisis, make sure you are not left alone.  If someone else is in crisis, make sure he/she/they is not left alone  Call 988 OR 1-800-273-TALK  24 Hour Availability for Walk-IN services  [facility]  [address] [place], [place] FBPZW [place] [phone] Crisis [phone]    Other crisis  resources:  Family Service of the [employer] (Domestic Violence, Rape & Victim Assistance [phone]  [facility] [employer] Crisis Services    (ONLY from 8am-4pm)    [phone]  Therapeutic Alternative Mobile Crisis Unit (24/7)   [phone]  [place] National Suicide Hotline   (782)644-1246 Len Childs)    Therapy and Counseling Resources   Most providers on this list will take Medicaid. Patients with commercial insurance or Medicare should contact their insurance company to get a list of in network providers.  [facility] (spanish speaking therapist available)(habla espanol)(take medicare and medicaid)  [address], [place], [place] [address], [email or web address] [email or web address] [phone]  Best[facility]:Psychiatry and Counseling [address] [place], [place] [address] [phone]  [facility]   [address] [place], [place] [address]      [phone]  [facility] (spanish available) [address]  [place], [place] [address] [phone]  [facility] (take [facility] and medicare) [address]  [place], [place] [address]       [phone]     [facility] (virtual only) [phone]  Jovita Kussmaul [facility] [address], [place], [place] [phone]  [facility]:  [address] [place] [phone]  [facility]:  [address] Diener [phone]  [facility] (under & uninsured) [address]   [place]  [phone]    [email or web address]    [facility] [address]  [place]    [phone]  [facility] -[address]     Phone:  [phone]     [facility]  [address]  [phone]   [facility] [address]      [place], [place] [phone]  [facility]: [address] [place], [place], [place]  [phone]   [facility] (Spanish therapist) [email or web address].[email or web address]/  [address]   [place] [address]    [phone]    [facility]   [address],  [place], [place]  [phone]   [facility]  [address] [place]  [phone]  [facility]  [address] [place], [place]        [phone]  Open Access/Walk In Clinic under & uninsured  [facility]  [address] [place], [place] [phone] Crisis [phone]  Family Service of the [place],  (Spanish)   [address] [place], [place]: [phone]) 8:30 - 12; 1 - 2:30  Family Service of the [employer],  [address] [place], [place]    [phone]):8:30 - 12; 2 - 3PM  [facility] [employer],  [address]  [place]; [phone]):   Mon - Fri 8 AM - 5 PM  Al[facility] Drug Services [address] [place]  MWF 12:30 to 3:00 or call to schedule an appointment  [phone]  Specific Provider options Psychology Today  https://www.psychologytoday.com/us click on find a therapist  enter your zip code left side and select or tailor a therapist for your specific need.   [facility] Provider Directory [email or web address]  (Medicaid)   Follow all drop down to find a provider  Social Support program Mental Health [place] [phone] or PhotoSolver.pl [address], [place], [place] Recovery support and educational   24- Hour Availability:   [facility]  [address] [place], [place] [phone] Crisis [phone]  Family Service of the [employer] [phone]  [place] [facility]  [phone]   [facility]  [phone] (after hours)  Therapeutic Alternative/Mobile Crisis   [phone]  [place] National Suicide  Hotline  213-050-7996 Len Childs)  Call 911 or go to emergency room  [facility]  [phone]);  [place] and [employer]  [phone]); [place], [place], [place], [place], [place], [place], [place]

## 2021-06-06 NOTE — Progress Notes (Signed)
SUBJECTIVE:   CHIEF COMPLAINT / HPI:   Vaginal Discharge Thick discharge, started 1-2 weeks ago. No burning or itching, some pain with intercourse on occasion, in right abdomen. Appreciates a fishy odor. No new partners.   HTN BP Readings from Last 3 Encounters:  06/06/21 (!) 143/101  06/03/21 (!) 167/108  05/28/21 (!) 187/126  Meds: HCTZ 25 mg Sympt: No CP, SOB Compliance: taking daily  Depression/Bipolar F/u with [facility]  Substance Abuse Patient wishing to stop using cocaine  Wegner's Past Doctor Hx: Francee Gentile, MD   [address]   [place], [place] [address]   [phone] (Work)   [phone] (Fax)      PERTINENT  PMH / PSH: Bipolar, HTN, Substance use, Wegners    OBJECTIVE:  BP (!) 143/101   Pulse 78   Wt 145 lb (65.8 kg)   SpO2 100%   BMI 24.13 kg/m   GU: Vulva normal in appearence, vagina pink with white discharge, no mass or lesion, cervix  pink, normal in appearance, no discharge from os, no lesion General: NAD, pleasant, able to participate in exam Respiratory: normal effort, Psych: Normal affect and mood  ASSESSMENT/PLAN:  Vaginal Discharge Patient reporting thick white discharge for last 1-2 weeks, no other symptoms, no new partners. Performed wet prep, will screen for GC/Chlamydia, which was positive for bacteria, clue cells, and wiff test, concerning for BV. Will treat with Metronidazole -Metronidazole 500 mg BID x 7 days   Bipolar 1 disorder (HCC) Patient with history of Bipolar disorder, [facility] She denies any active thoughts of SI/HI, but does not she has the thoughts of SI from time to time. Says that her son motivates her to keep on going, and that in a crisis she can call her mother, or pray. She was given resources for suicide hotline and therapy resources.  -Follow up with [facility] -[facility] up with [facility] in 1 week -Therapy resources given -Resources for SI crisis given  Cocaine abuse [facility]) Patient with history of  daily cocaine use. Expresses interest in wanting to quit. -CCM referral placed -Narcotics Anonymous resources given  HTN (hypertension) Patient with Hx of HTN on HCTZ 25 mg. Reports continues to take medicine daily. Also reports daily cocaine use, would not add beta blocker to regimen. Patient had nexplanon placed last year, will try ARB. 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 Patient lost to follow up, wanting to reestablish care.  -Provided contact information for Rheumatologist, encouraged follow up.    Orders Placed This Encounter  Procedures   Basic Metabolic Panel   AMB Referral to [facility] Coordinaton    Referral Priority:   Routine    Referral Type:   Consultation    Referral Reason:   Care Coordination    Number of Visits Requested:   1   POCT Wet Prep [facility])   Meds ordered this encounter  Medications   losartan (COZAAR) 25 MG tablet    Sig: Take 1 tablet (25 mg total) by mouth at bedtime.    Dispense:  30 tablet    Refill:  0   Patient Instructions  It was great to see you! Thank you for allowing me to participate in your care!  I recommend that you always bring your medications to each appointment as this makes it easy to ensure we are on the correct medications and helps [place] not  miss when refills are needed.  Our plans for today:   - High Blood Pressure  Starting Losartan 25 mg daily  Continue HCTZ (Hydrochlorothiazide) 25 mg daily  Follow up next week  - Vaginal Discharge  Pelvic exam, will call with results  - Substance Use  Look into local Narcotics Anonymous: Events  [employer] of Narcotics Anonymous (greensborona.org)   Google search: [facility] Narcotics Anonymous  - Depression/Bipolar  Follow up with [employer] after visit  [facility]  [address] [place], [address]  -  Wegner's  Follow up with Rheumatologist  [facility] - [address]  Francee Gentile, MD    [address]   [place], [place] [address]   [phone]    We are checking some labs today, I will call you if they are abnormal will send you a MyChart message or a letter if they are normal.  If you do not hear about your labs in the next 2 weeks please let us know.  Take care and seek immediate care sooner if you develop any concerns.   Dr. Bess Kinds, MD [facility] Family Medicine   Resources for Suicidal Ideation  If you are feeling suicidal or depression symptoms worsen please immediately go to:   If you are thinking about harming yourself or having thoughts of suicide, or if you know someone who is, seek help right away. If you are in crisis, make sure you are not left alone.  If someone else is in crisis, make sure he/she/they is not left alone  Call 988 OR 1-800-273-TALK  24 Hour Availability for Walk-IN services  [facility]  [address] [place], [place] RSWNI [place] [phone] Crisis [phone]    Other crisis resources:  Family Service of the [employer] (Domestic Violence, Rape & Victim Assistance [phone]  [facility] [employer] Crisis Services    (ONLY from 8am-4pm)    [phone]  Therapeutic Alternative Mobile Crisis Unit (24/7)   [phone]  [place] National Suicide Hotline   (619)097-2187 Len Childs)    Therapy and Counseling Resources   Most providers on this list will take Medicaid. Patients with commercial insurance or Medicare should contact their insurance company to get a list of in network providers.  [facility] (spanish speaking therapist available)(habla espanol)(take medicare and medicaid)  [address] [place], [place], [place] [address], [email or web address] [email or web address] [phone]  Best[facility]:Psychiatry and Counseling [address] [facility] [address] [place], [place]  [address] [phone]  [facility]   [address], Suite [place], [place] [address]      [phone]  [facility] (spanish available) [address]  [place], [place] [address] [phone]  [facility] (take [facility] and medicare) [address]  [place], [place] [address]       [phone]     [facility] (virtual only) [phone]  [facility]ovita Kussmaul [facility] [address] [place], [place] [phone]  [facility]:  [address] [place] [phone]  [facility]:  [address]ie Diener [phone]  [facility] (under & uninsured) [address]   [place] [phone]    [email or web address]    [facility] [address]  [place]    [phone]  [facility] [place] -[address] [place] [address]     Phone:  [phone]  [place]-  [address]  [phone]   [facility]      [place], [place] [phone]  [facility]: [address], [place], [place]  [phone]   [facility] (Spanish therapist) [email or web address].[email or web address]/  [address]   [place] [id number]    [phone]    [facility]   [address],  [place], [place]  [phone]   [facility]  [address] [place]  [phone]  [facility]  [address] [place], [place]        [phone]  Open Access/Walk In Clinic under & uninsured  [facility]  [address] [place], [place] [phone] Crisis [phone]  [facility] of the [facility],  (Spanish)   [address], [place]: [phone]) 8:30 - 12; 1 - 2:30  [facility] Service of the [employer] [employer],  [address], [place]    [phone]):8:30 -  12; 2 - 3PM  [facility],  [address],  [place]; [phone]):   Mon - Fri 8 AM - 5 PM  [facility] [address] [place]  MWF 12:30 to 3:00 or call to schedule an appointment  [phone]  Specific Provider options Psychology Today  https://www.psychologytoday.com/us click on find a therapist  enter your zip code left side and select or tailor a therapist for your specific need.   [facility] Provider Directory [email or web address]  (Medicaid)   Follow all drop down to find a provider  Social Support program Mental Health [place] [phone]) [phone] or [email or web address] [address], [place], [place] Recovery support and educational   24- Hour Availability:   [facility]  [address] [place], [place] [phone] Crisis [phone]  [facility] Service of the [facility]  Crisis Line [phone]  [facility]  [phone]   [facility] Crisis Services  [phone] (after hours)  Therapeutic Alternative/Mobile Crisis   [phone]  [place] National Suicide Hotline  [phone] Len Childs(TALK)  Call 911 or go to emergency room  [facility] Crisis Line  [phone]);  [place] and [employer]   [place] ACCESS  [phone]); [place], [place], [place], [place], [place], [place], [place]    No follow-ups on file. @SIGNNOTE @

## 2021-06-06 NOTE — Assessment & Plan Note (Signed)
Patient with hx of Granulomatosis with polyangiitis. Patient lost to follow up, wanting to reestablish care.  -Provided contact information for Rheumatologist, encouraged follow up.

## 2021-06-06 NOTE — Assessment & Plan Note (Signed)
Patient with history of daily cocaine use. Expresses interest in wanting to quit. -CCM referral placed -Narcotics Anonymous resources given

## 2021-06-07 ENCOUNTER — Telehealth: Payer: Self-pay | Admitting: *Deleted

## 2021-06-07 LAB — BASIC METABOLIC PANEL
BUN/Creatinine Ratio: 7 — ABNORMAL LOW (ref 9–23)
BUN: 5 mg/dL — ABNORMAL LOW (ref 6–20)
CO2: 23 mmol/L (ref 20–29)
Calcium: 9.9 mg/dL (ref 8.7–10.2)
Chloride: 101 mmol/L (ref 96–106)
Creatinine, Ser: 0.74 mg/dL (ref 0.57–1.00)
Glucose: 88 mg/dL (ref 70–99)
Potassium: 4.2 mmol/L (ref 3.5–5.2)
Sodium: 138 mmol/L (ref 134–144)
eGFR: 107 mL/min/{1.73_m2} (ref >59)

## 2021-06-07 LAB — CERVICOVAGINAL ANCILLARY ONLY
Chlamydia: NEGATIVE
Comment: NEGATIVE
Comment: NEGATIVE
Comment: NORMAL
Neisseria Gonorrhea: NEGATIVE
Trichomonas: NEGATIVE

## 2021-06-07 MED ORDER — METRONIDAZOLE 500 MG PO TABS: 500.0000 mg | ORAL_TABLET | Freq: Two times a day (BID) | ORAL | 0 refills | Status: AC

## 2021-06-07 NOTE — Chronic Care Management (AMB) (Signed)
  Care Management   Outreach Note  06/07/2021 Name: Sandra Lee MRN: [id number] DOB: 11/15/1985  Referred by: Bess Kinds, MD Reason for referral : Care Coordination (Initial outreach to schedule referral with BSW)   An unsuccessful telephone outreach was attempted today. The patient was referred to the case management team for assistance with care management and care coordination.   Follow Up Plan:  The care management team will reach out to the patient again over the next 7 days. If patient returns call to provider office, please advise to call Embedded Care Management Care Guide Misty Stanley at [phone].  Gwenevere Ghazi  Care Guide, Embedded Care Coordination [facility] Management  Direct Dial: [phone]

## 2021-06-13 ENCOUNTER — Encounter: Payer: Self-pay | Admitting: Student

## 2021-06-13 ENCOUNTER — Other Ambulatory Visit: Payer: Self-pay

## 2021-06-13 ENCOUNTER — Ambulatory Visit (INDEPENDENT_AMBULATORY_CARE_PROVIDER_SITE_OTHER): Payer: Medicaid Other | Admitting: Student

## 2021-06-13 VITALS — BP 167/114 | HR 110 | Wt 141.0 lb

## 2021-06-13 DIAGNOSIS — M3130 Wegener's granulomatosis without renal involvement: Secondary | ICD-10-CM | POA: Diagnosis not present

## 2021-06-13 DIAGNOSIS — R55 Syncope and collapse: Secondary | ICD-10-CM | POA: Diagnosis present

## 2021-06-13 DIAGNOSIS — I10 Essential (primary) hypertension: Secondary | ICD-10-CM | POA: Diagnosis not present

## 2021-06-13 MED ORDER — MUPIROCIN 2 % EX OINT
1.0000 | TOPICAL_OINTMENT | Freq: Two times a day (BID) | CUTANEOUS | 2 refills | Status: DC
Start: 2021-06-13 — End: 2023-04-03

## 2021-06-13 MED ORDER — LOSARTAN POTASSIUM 25 MG PO TABS: 50.0000 mg | ORAL_TABLET | Freq: Every day | ORAL | 1 refills | Status: DC

## 2021-06-13 NOTE — Assessment & Plan Note (Signed)
Patient with concerns of sinus type infection from disease. Reports history of similar symptoms and needing drainage of nose. Patient cranial nerve exam intact with no FND, and mild tenderness to palpation on nasal sinuses. Denies any constitutional symptoms of fever, malaise, but reports she feels tired. Nose on exam with notable mucous and minnimal erythema. Less concerned for sinusitis, given lack of constitutional symptoms and physical exam. Patient would likely benefit from follow up with ENT providers. -Follow up with ENT, (contact in AVS) -Continue Bactroban nasal rinse

## 2021-06-13 NOTE — Assessment & Plan Note (Addendum)
Patient BP remains elevated today at 167/114, no symptoms of CP or SOB. Patient reports compliance with medication, and cessation of cocaine use since last appointment on 5/25. Patient would benefit from increased antihypertensive support, will increase dose of losartan. -HCTZ 25 mg -Losartan 50 mg -BMP -F/u in 1 week

## 2021-06-13 NOTE — Patient Instructions (Addendum)
It was great to see you! Thank you for allowing me to participate in your care!  I recommend that you always bring your medications to each appointment as this makes it easy to ensure we are on the correct medications and helps [place] not miss when refills are needed.  Our plans for today:  - Syncope (Passing Out)  BP was normal with positional changes. We are worried about anemia, electrolytes issues or thyroid issues  Labs: BMP, CBC, TSH, mg  - High Blood Pressure   BP still high, will need to adjust medicine  Losartan 50 mg daily  - Nose issues  We will restart your Bactroban rinses - 1 inch squeezed into nasal saline and rinsed twice daily  Follow up with ENT doctors   [facility]   [address]   [address]   [place], [place] [address]   [phone]      - Follow up in 1 week  Can follow up with one of my partners  Seek medical care if: -Develop fever of 100.4 or greater -Develop facial swelling/pain -Develop persistent headaches  We are checking some labs today, I will call you if they are abnormal will send you a MyChart message or a letter if they are normal.  If you do not hear about your labs in the next 2 weeks please let us know.  Take care and seek immediate care sooner if you develop any concerns.   Dr. Bess Kinds, MD [facility]

## 2021-06-13 NOTE — Progress Notes (Signed)
SUBJECTIVE:   CHIEF COMPLAINT / HPI:   F/u  HTN and Pysch visit  Complaints: Syncope/Presyncope  Patient appreciates that she got real hot, passed out, and woke up on the floor. Sunday-Monday started feeling very warm, and felt like she was close to falling. First episode she remembers feeling hot, and weak, and was pouring sweat, she woke up on the floor to someone calling her name. She was unsure how long she was down, and her face was a little sore. 2nd episode noted that didn't pass out, but did get really warm. Started BP med on Friday or Saturday. Happened at night and early morning.   Worried about an infection.  Drainage from nose for months, with foul smell. No fever's or body aches. Stomach aches sometimes, it can start in chest and work down to stomach like gas. Sometimes feels like a ball is in abdomen/stomach area. Having constipation or diarrhea. Not taking any meds. Had diarrhea yesterday, two episodes. No episodes of stomach aches before diarrhea.    HTN BP Readings from Last 3 Encounters:  06/13/21 (!) 167/114  06/06/21 (!) 143/101  06/03/21 (!) 167/108  Meds: HCTZ 25, Losartan 25  Compliance: Taking meds daily Symptoms: No CP or SOB   Psych Hasn't been to [facility], but plans to.  Cocaine Abuse  CCM called patient, voicemail not set up, patient reports her phone was temporarily off, but is now back on.   PERTINENT  PMH / PSH: HTN, Wegners, Cocaine abuse  OBJECTIVE:  BP (!) 167/114   Pulse (!) 110   Wt 141 lb (64 kg)   SpO2 100%   BMI 23.46 kg/m   Nasal: minimal erythema noted on nasal turbinates with mucous Neuro: Cranial nerves intact, sensation intact and symmetric in face General: NAD, pleasant, able to participate in exam Cardiac: RRR, no murmurs auscultated. Respiratory: CTAB, normal effort, no wheezes, rales or rhonchi Abdomen: soft, non-tender, non-distended, normoactive bowel sounds Extremities: warm and well perfused, no edema or cyanosis. Skin:  warm and dry, no rashes noted Neuro: alert, no obvious focal deficits, speech normal Psych: Normal affect and mood  ASSESSMENT/PLAN:  HTN (hypertension) Patient BP remains elevated today at 167/114, no symptoms of CP or SOB. Patient reports compliance with medication, and cessation of cocaine use since last appointment on 5/25. Patient would benefit from increased antihypertensive support, will increase dose of losartan. -HCTZ 25 mg -Losartan 50 mg -BMP -F/u in 1 week  Granulomatosis with polyangiitis (HCC) Patient with concerns of sinus type infection from disease. Reports history of similar symptoms and needing drainage of nose. Patient cranial nerve exam intact with no FND, and mild tenderness to palpation on nasal sinuses. Denies any constitutional symptoms of fever, malaise, but reports she feels tired. Nose on exam with notable mucous and minnimal erythema. Less concerned for sinusitis, given lack of constitutional symptoms and physical exam. Patient would likely benefit from follow up with ENT providers. -Follow up with ENT, (contact in AVS) -Continue Bactroban nasal rinse   Syncope Patient history concerning for Vasal Vagal syncope. Patient orthostatic BP normal today, less concerning for effect of BP meds. Patient could possibly be suffering from arhythmia, but EKG normal today with prolonged Qtc to 510. Patient also complaining of fatigue. Prolonged QTC could be be potential cause of syncope/fatigue. Patient could also be suffering from anemia or electrolyte derangement, or thyroid dysfunction. Patient may need zio/echo for evaluation of cardiac causes in future.  -CBC -BMP -TSH -Mg  Diarrhea Follow up at next  appointment  Cocaine Abuse Patient reports she has not used any cocaine since 06/06/21. Informed patient that CCM had reached out to speak with patient and offer resources, but phone was off and voicemail was not set up. Patient reported that her phone is back on, and that  she intends to set up her voice mail.   Orders Placed This Encounter  Procedures   CBC   Basic Metabolic Panel   TSH   Magnesium   Meds ordered this encounter  Medications   mupirocin ointment (BACTROBAN) 2 %    Sig: Apply 1 application. topically 2 (two) times daily. Place 1 inch strip in saline mixture and shake well to mix, rinse bilateral nasal cavities twice daily    Dispense:  30 g    Refill:  2

## 2021-06-14 LAB — CBC
Hematocrit: 45.8 % (ref 34.0–46.6)
Hemoglobin: 15.2 g/dL (ref 11.1–15.9)
MCH: 26.8 pg (ref 26.6–33.0)
MCHC: 33.2 g/dL (ref 31.5–35.7)
MCV: 81 fL (ref 79–97)
Platelets: 246 10*3/uL (ref 150–450)
RBC: 5.67 x10E6/uL — ABNORMAL HIGH (ref 3.77–5.28)
RDW: 13.8 % (ref 11.7–15.4)
WBC: 10.2 10*3/uL (ref 3.4–10.8)

## 2021-06-14 LAB — BASIC METABOLIC PANEL
BUN/Creatinine Ratio: 10 (ref 9–23)
BUN: 8 mg/dL (ref 6–20)
CO2: 20 mmol/L (ref 20–29)
Calcium: 10.5 mg/dL — ABNORMAL HIGH (ref 8.7–10.2)
Chloride: 96 mmol/L (ref 96–106)
Creatinine, Ser: 0.84 mg/dL (ref 0.57–1.00)
Glucose: 142 mg/dL — ABNORMAL HIGH (ref 70–99)
Potassium: 3.5 mmol/L (ref 3.5–5.2)
Sodium: 139 mmol/L (ref 134–144)
eGFR: 92 mL/min/{1.73_m2} (ref >59)

## 2021-06-14 LAB — MAGNESIUM: Magnesium: 2.5 mg/dL — ABNORMAL HIGH (ref 1.6–2.3)

## 2021-06-14 LAB — TSH: TSH: 2.13 u[IU]/mL (ref 0.450–4.500)

## 2021-06-14 NOTE — Chronic Care Management (AMB) (Signed)
  Care Management   Outreach Note  06/14/2021 Name: Sandra Lee MRN: [id number] DOB: Nov 21, 1985  Referred by: Bess Kinds, MD Reason for referral : Care Coordination (Initial outreach to schedule referral with BSW)   A second unsuccessful telephone outreach was attempted today. The patient was referred to the case management team for assistance with care management and care coordination.   Follow Up Plan:  The care management team will reach out to the patient again over the next 5 days.  If patient returns call to provider office, please advise to call Embedded Care Management Care Guide Misty Stanley* at [phone]  Care Guide, Embedded Care Coordination [facility]  Care Management  Direct Dial: [phone]

## 2021-06-17 NOTE — Chronic Care Management (AMB) (Signed)
  Care Management   Outreach Note  06/17/2021 Name: Sandra Lee MRN: [id number] DOB: [birth year]  Referred by: Bess Kinds, MD Reason for referral : Care Coordination (Initial outreach to schedule referral with BSW)   Third unsuccessful telephone outreach was attempted today. The patient was referred to the case management team for assistance with care management and care coordination. The patient's primary care provider has been notified of our unsuccessful attempts to make or maintain contact with the patient. The care management team is pleased to engage with this patient at any time in the future should he/she be interested in assistance from the care management team.   Follow Up Plan:  We have been unable to make contact with the patient. The care management team is available to follow up with the patient after provider conversation with the patient regarding recommendation for care management engagement and subsequent re-referral to the care management team.   [facility] Guide, Embedded Care Coordination [facility]  Care Management  Direct Dial: [phone]

## 2021-06-18 ENCOUNTER — Encounter: Payer: Self-pay | Admitting: *Deleted

## 2021-06-19 ENCOUNTER — Encounter: Payer: Self-pay | Admitting: Student

## 2021-06-20 ENCOUNTER — Encounter: Payer: Self-pay | Admitting: Student

## 2021-06-20 ENCOUNTER — Ambulatory Visit (INDEPENDENT_AMBULATORY_CARE_PROVIDER_SITE_OTHER): Payer: Medicaid Other | Admitting: Student

## 2021-06-20 VITALS — BP 117/80 | HR 80 | Wt 148.4 lb

## 2021-06-20 DIAGNOSIS — I10 Essential (primary) hypertension: Secondary | ICD-10-CM

## 2021-06-20 DIAGNOSIS — F1994 Other psychoactive substance use, unspecified with psychoactive substance-induced mood disorder: Secondary | ICD-10-CM | POA: Diagnosis not present

## 2021-06-20 MED ORDER — SERTRALINE HCL 50 MG PO TABS: 50.0000 mg | ORAL_TABLET | Freq: Every day | ORAL | 3 refills | Status: DC

## 2021-06-20 NOTE — Patient Instructions (Addendum)
It was great seeing you today.  As we discussed, - Increased Zoloft to 50 mg daily. - Schedule appointment with rheumatologist - Schedule appointment with mental health therapist - Let us know if you need smoking cessation resources  We will plan to see you again 2-3 months   If you have any questions or concerns, please feel free to call the clinic.    Be well,  Dr. Darral Dash [facility] [phone]

## 2021-06-20 NOTE — Assessment & Plan Note (Signed)
BMP on 6/1 with hypercalcemia to 10.5.  - Discontinue Calcium supplement - Monitor for signs of persistent elevation- can consider d/c'ing HCTZ in future

## 2021-06-20 NOTE — Assessment & Plan Note (Signed)
Discussed tobacco cessation resources. Patient smoking 1/2 PPD. Patient plans to schedule appointment with therapist. Discussed that insomnia and diarrhea are likely side effects of resuming Zoloft a few weeks ago and should self-resolve. - Increase Zoloft from 25 mg daily to 50 mg daily - F/u in 2-3 months

## 2021-06-20 NOTE — Progress Notes (Addendum)
    SUBJECTIVE:   CHIEF COMPLAINT / HPI:   Hypertension Losartan increased at visit 1 week ago from 25 mg daily to 50 mg daily. Taking medication as prescribed. No CP, SOB, visual changes  Syncope Improved. No further episodes. Says she is feeling much better. Was having a bit of insomnia and diarrhea in setting of resuming Zoloft 2 weeks ago.  Polysubstance use Patient smokes 1/2 PPD cigarettes. Has nicotine patches at home. Hx cocaine use.   PERTINENT  PMH / PSH: Reviewed  OBJECTIVE:   BP 117/80   Pulse 80   Wt 148 lb 6.4 oz (67.3 kg)   SpO2 98%   BMI 24.70 kg/m   General: Well-appearing, in no distress CV: RRR Resp: Normal WOB on room air Skin: Warm, dry   ASSESSMENT/PLAN:   HTN (hypertension) BP much improved today and within goal range at 117/80.  - Continue Losartan 50 mg daily and HCTZ 25 mg daily - BMP today to ensure no electrolyte derangements in light of increase in antihypertensive 1 week ago - F/u in 2 -3 months  Substance induced mood disorder (HCC) Discussed tobacco cessation resources. Patient smoking 1/2 PPD. Patient plans to schedule appointment with therapist. Discussed that insomnia and diarrhea are likely side effects of resuming Zoloft a few weeks ago and should self-resolve. - Increase Zoloft from 25 mg daily to 50 mg daily - F/u in 2-3 months  Hypercalcemia BMP on 6/1 with hypercalcemia to 10.5.  - Discontinue Calcium supplement - Monitor for signs of persistent elevation- can consider d/c'ing HCTZ in future      Darral Dash, DO [facility]

## 2021-06-20 NOTE — Assessment & Plan Note (Signed)
BP much improved today and within goal range at 117/80.  - Continue Losartan 50 mg daily and HCTZ 25 mg daily - BMP today to ensure no electrolyte derangements in light of increase in antihypertensive 1 week ago - F/u in 2 -3 months

## 2021-06-21 ENCOUNTER — Encounter: Payer: Self-pay | Admitting: Student

## 2021-06-21 LAB — BASIC METABOLIC PANEL
BUN/Creatinine Ratio: 16 (ref 9–23)
BUN: 14 mg/dL (ref 6–20)
CO2: 24 mmol/L (ref 20–29)
Calcium: 9.3 mg/dL (ref 8.7–10.2)
Chloride: 104 mmol/L (ref 96–106)
Creatinine, Ser: 0.9 mg/dL (ref 0.57–1.00)
Glucose: 86 mg/dL (ref 70–99)
Potassium: 4.3 mmol/L (ref 3.5–5.2)
Sodium: 141 mmol/L (ref 134–144)
eGFR: 85 mL/min/{1.73_m2} (ref >59)

## 2021-07-17 IMAGING — DX DG CHEST 1V PORT
1 series · 1 of 1 positions shown · non-contrast
Comparison: None.

CLINICAL DATA: Pt st's she snorted cocaine earlier today (14 hours
ago) St's now she feels jittery. Also st's after she snorted it she
felt like her throat was swelling. Pt appears very anxious at this
time and tearful. Pt st's she thinks.*comment was truncated*SOB

EXAM:
PORTABLE CHEST 1 VIEW

[chest]
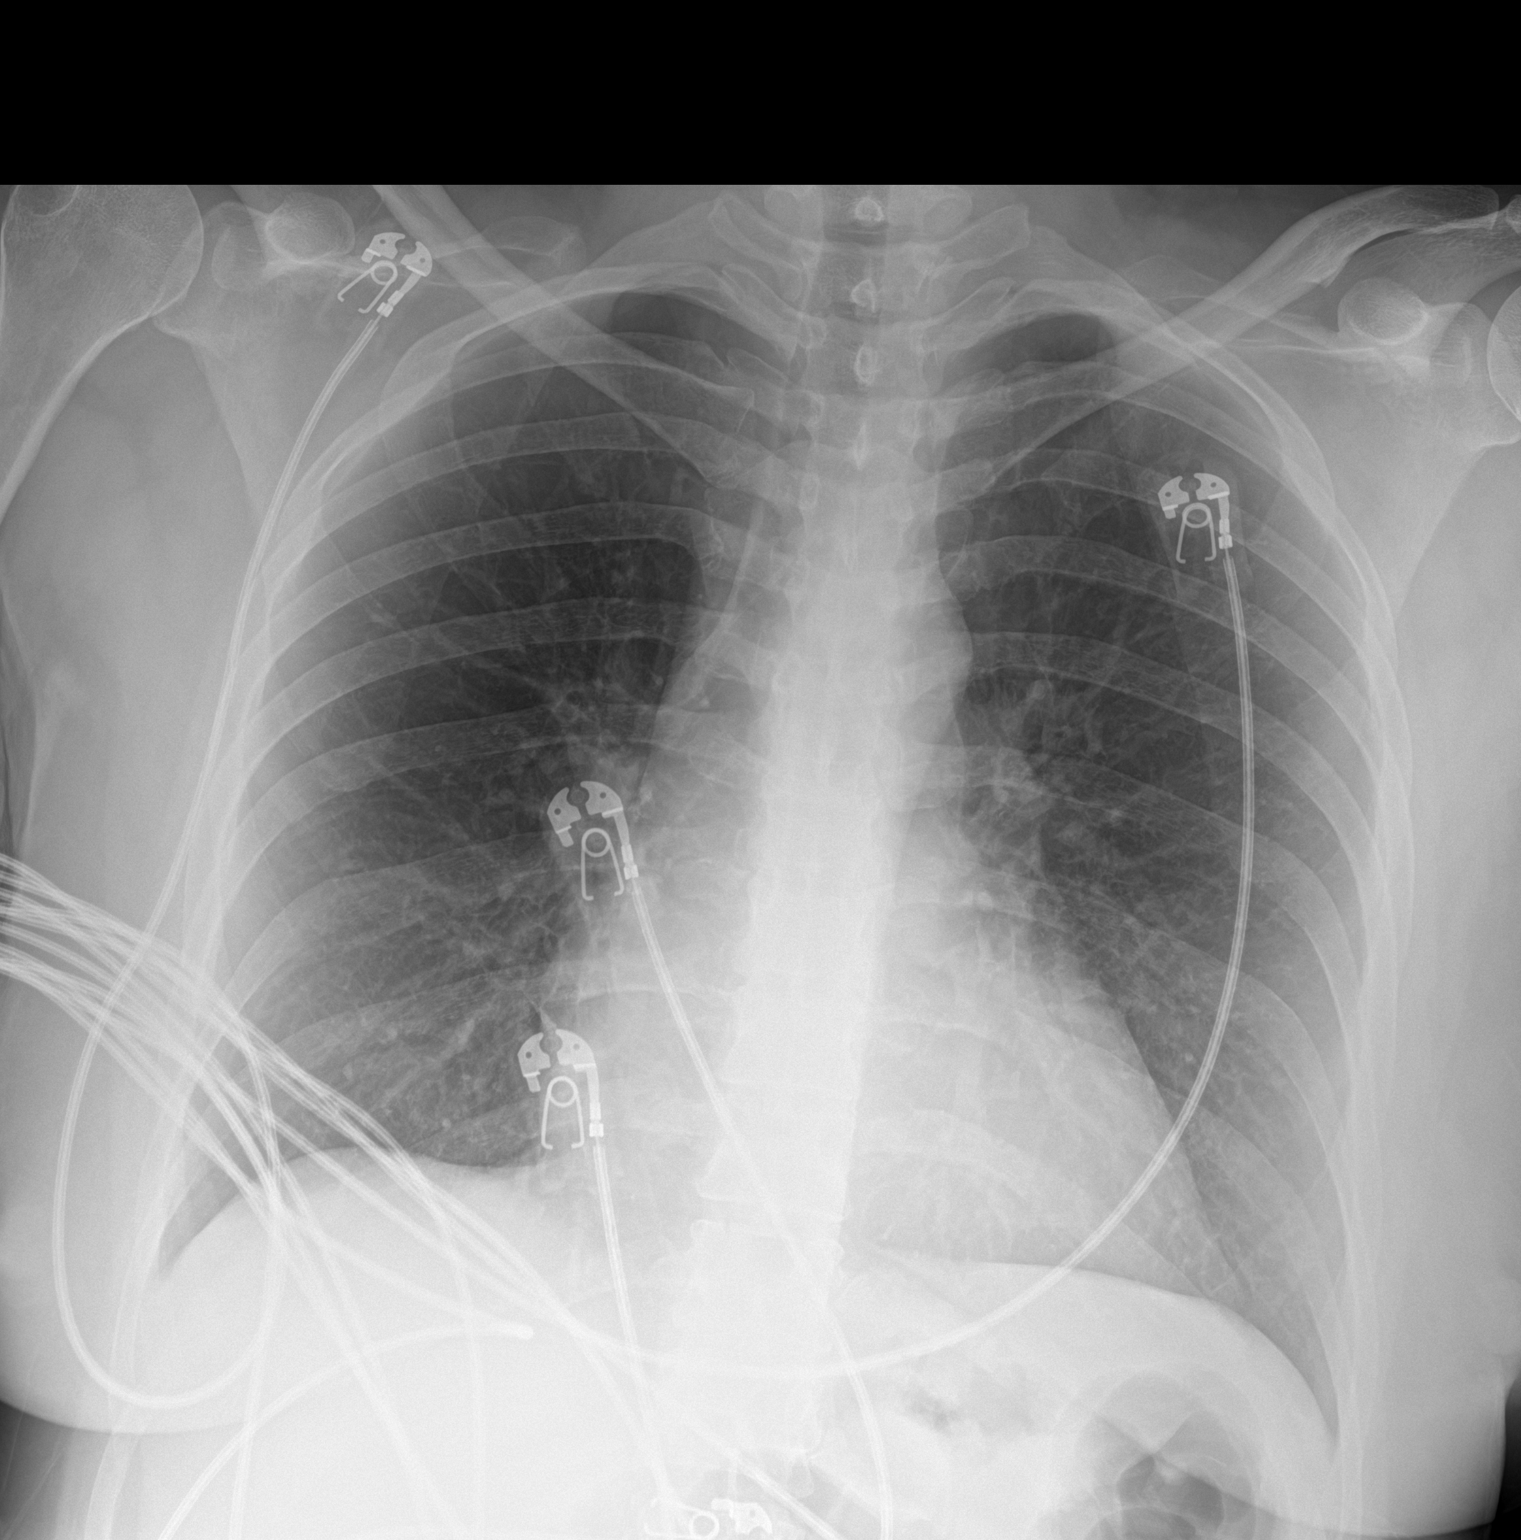

[1 of 1 positions shown; findings below may reference images not displayed]

FINDINGS: Normal mediastinum and cardiac silhouette. Normal pulmonary
vasculature. No evidence of effusion, infiltrate, or pneumothorax.
No acute bony abnormality.
IMPRESSION: No acute cardiopulmonary process.

## 2021-10-07 ENCOUNTER — Other Ambulatory Visit: Payer: Self-pay | Admitting: Student

## 2021-10-08 ENCOUNTER — Other Ambulatory Visit: Payer: Self-pay | Admitting: Student

## 2021-11-23 ENCOUNTER — Other Ambulatory Visit: Payer: Self-pay | Admitting: Student

## 2021-12-24 NOTE — Progress Notes (Unsigned)
  SUBJECTIVE:   CHIEF COMPLAINT / HPI:   BP Check  HTN: Meds: HCTZ 25 mg, Losartan 50 mg Compliance:   PERTINENT  PMH / PSH: ***  Past Medical History:  Diagnosis Date   Abnormal Pap smear    Depression    hx of depression - not taking medication   Heart murmur 36 yrs old   not sure if it is still present.   PIH (pregnancy induced hypertension), antepartum 11/19/2010    OBJECTIVE:  There were no vitals taken for this visit. Physical Exam   ASSESSMENT/PLAN:  There are no diagnoses linked to this encounter. No follow-ups on file. Bess Kinds, MD 12/24/2021, 4:06 PM PGY-***, [facility] {    This will disappear when note is signed, click to select method of visit    :1}

## 2021-12-24 NOTE — Patient Instructions (Incomplete)
It was great to see you! Thank you for allowing me to participate in your care!  I recommend that you always bring your medications to each appointment as this makes it easy to ensure we are on the correct medications and helps [place] not miss when refills are needed.  Our plans for today:  - Depression / Bipolar Follow up in 1 week for depression/sleep  Find psychiatrist and therapist of list below  If you start to have thoughts of ending your life   Call: 988 Suicide hotline, any time of day or night   Go to Emergency Room  If you start to hallucinate or see/hear things other's can't go to Emergency Room right away!  - High Blood Pressure Blood pressure at goal today.   Vaccines: Tdap and Flu   Take care and seek immediate care sooner if you develop any concerns.   Dr. Bess Kinds, MD [facility]      Psychiatry Resource List (Adults and Children) Most of these providers will take Medicaid. please consult your insurance for a complete and updated list of available providers. When calling to make an appointment have your insurance information available to confirm you are covered.   [facility]:Psychiatry and Counseling [address] [place], [place] [address] [phone]  [facility]  [address] [place], [place] [phone] Crisis [phone]   [facility]:   [facility]: [address]     [phone]   [facility]: [address] [place]. [place],        [phone] [place]: [address] [phone],    [phone] [place]: [phone] [address],                   [phone] Children: [facility] [address]         [phone]  [facility] (virtual only) [phone]   [facility]  (Psychiatry only; Adults /children 12 and over, will take Medicaid)  [address] Laurell Josephs [address], [place], [place] [address]        [phone]   [facility] (Psychiatry & counseling ; adults & children ; will take Medicaid [address]  [place] [address]  Go on-line to complete referral ( [email or web address]://www.[email or web address].org/en/make-[email or web address]-referral [phone]    (Spanish speaking therapists)  [facility]  Psychiatry & counseling; Adults and children;  Call Registration prior to scheduling an appointment [phone] [address]    [place], [place] [address]    [phone]  [facility] (Psychiatry & counseling; adults & children; Medicare no Medicaid)  [address]   [place], [place]  [address]      [phone]    [facility] (up to age 58)  Psychiatry & counseling ,will take Medicaid, must do counseling to receive psychiatry services  [address] [place] [address]        [phone]  [facility] (Psychiatry & counseling; adults & children; will take Medicaid) Will need a referral from provider [address],  [place], [place]  [phone]   [facility] --- Walk-In Mon-Friday 8am-3pm ( will take Medicaid, Psychiatry, Adults & children,  [address] [place], [place]   [phone]   [facility] [place]--, Walk-in M-F 8am-12pm and 1pm -3pm   (Counseling, Psychiatry, will take Medicaid, adults & children)  [address], [place], [place]  [phone]           Therapy and Counseling Resources Most providers on this list will take Medicaid. Patients with commercial insurance or Medicare should contact their insurance company to get a list of in network providers.  [facility] [employer] (takes children) Location 1: [address] [place], [place] [address] Location 2: [address] [place], [place] [address] [phone]   [facility] (spanish speaking therapist available)(habla espanol)(take medicare and medicaid)  [address], [place], [place]  [address], [email or web address] [email or web address] [phone]  [facility]:Psychiatry and Counseling [address] [facility] [address] [place], [place] [address] [phone]  [facility]   [address] [place], [place] [address]      [phone]  [facility] (spanish available) [address]  [place], [place] [address] [phone]  [facility] (take [facility] and medicare) [address]  [place], [place] [address]       [phone]     [facility] (virtual only) [phone]  Jovita Kussmaul [facility] [address], [place], [place] [phone]  [facility]:  [address] [place] [phone]  [facility]:  [address] [place] [place] [phone]  [facility] (under & uninsured) [address]   [place] [phone]    [email or web address]    [facility] [address]  [place]    [phone]  [facility] -[address]     Phone:  [phone]     [facility]-  [address]  [phone]   [facility] [address]      [place], [place] [phone]  [facility]: [address] [place], [place], [place]  [phone]   [facility] (Spanish therapist) [email or web address]  [address] [place]  [address]   [place] [address]    [phone]    [facility]   [address],  [place], [place]  [phone]   [facility]  [address] [place]  [phone]  [facility]  [address] [place], [place]        [phone]  Open Access/Walk In Clinic under & uninsured  [facility]  [address] [place], [place] [phone] Crisis [phone]  [facility] [place],  (Spanish)   [address],  [place]: [phone]) 8:30 - 12; 1 - 2:30  [facility] [employer],  [address] [place], [place]    [phone]):8:30 - 12; 2 - 3PM  [facility] [employer],  [address]  [place]; [phone]):   Mon - Fri 8 AM - 5 PM  [facility] [address] [place]  MWF 12:30 to 3:00 or call to schedule an appointment  [phone]  Specific Provider options Psychology Today  https://www.psychologytoday.com/us click on find a therapist  enter your zip code left side and select or tailor a therapist for your specific need.   [facility] Provider Directory [email or web address]  (Medicaid)   Follow all drop down to find a provider  Social Support program Mental Health [place] [phone] or PhotoSolver.pl [address], [place], [place] Recovery support and educational   24- Hour Availability:   [facility]  [address] [place], [place] [phone] Crisis [phone]  [facility] [phone]  [facility] Crisis Service  [phone]   [facility]  [phone] (after hours)  Therapeutic Alternative/Mobile Crisis   [phone]  [place] National Suicide Hotline  [phone] Len Childs)  Call 911 or go to emergency room  [facility]  [phone]);  [place] and [employer]  [phone]); [place], [place], [place], [place], [place], [place], [place]

## 2021-12-25 ENCOUNTER — Ambulatory Visit (INDEPENDENT_AMBULATORY_CARE_PROVIDER_SITE_OTHER): Payer: Medicaid Other | Admitting: Student

## 2021-12-25 ENCOUNTER — Encounter: Payer: Self-pay | Admitting: Student

## 2021-12-25 VITALS — BP 120/78 | HR 94 | Temp 98.3°F | Ht 65.0 in | Wt 150.2 lb

## 2021-12-25 DIAGNOSIS — Z23 Encounter for immunization: Secondary | ICD-10-CM

## 2021-12-25 DIAGNOSIS — I10 Essential (primary) hypertension: Secondary | ICD-10-CM | POA: Diagnosis not present

## 2021-12-25 DIAGNOSIS — F1410 Cocaine abuse, uncomplicated: Secondary | ICD-10-CM

## 2021-12-25 DIAGNOSIS — M3130 Wegener's granulomatosis without renal involvement: Secondary | ICD-10-CM

## 2021-12-25 DIAGNOSIS — F319 Bipolar disorder, unspecified: Secondary | ICD-10-CM

## 2021-12-25 NOTE — Assessment & Plan Note (Signed)
Patient report's she's been free of cocaine for last 3-4 weeks -Encourage abstinence

## 2021-12-25 NOTE — Assessment & Plan Note (Signed)
Saw Rheum 10/04/21, recommending: -Prednisone 40 mg for 1 month -Methotrexate 2.5 mg -Patient to f/u in 1 month, and needs to return

## 2021-12-25 NOTE — Assessment & Plan Note (Signed)
BP at goal today.  -Continue HCTZ 25 mg and Losartan 25 mg

## 2021-12-25 NOTE — Assessment & Plan Note (Signed)
Patient noting increased depression with paranoia. Has been feeling low because she feels like her mental health is declining. She's been off her meds for 3-4 months and hasn't gone to [facility] for an appointment because she doesn't like their service. She notes sometimes wishing she was not around, but denies any SI/HI today. She reports having support with her church group/friends that she see regularly and that her son keeps her from thinking more about/planning SI. He's young and depends on her. Patient also complaining of sleep issues, likely coming from her bipolar depression. Encouraged patient to find Psychiatrist and therapist, list given in AVS. -F/u in 1 week -Find Psychiatrist/therapist -Suicide hotline given

## 2022-01-02 ENCOUNTER — Ambulatory Visit: Payer: Medicaid Other | Admitting: Student

## 2022-01-02 NOTE — Progress Notes (Deleted)
  SUBJECTIVE:   CHIEF COMPLAINT / HPI:   F/u bipolar depression Patient seen 12/13 with notable depression, off of meds for 3-4 months, looking for psychiatrist/therapist Patient previously in abilify 5 mg and zoloft 50 mg 09/10/20  PERTINENT  PMH / PSH: ***  Past Medical History:  Diagnosis Date   Abnormal Pap smear    Depression    hx of depression - not taking medication   Heart murmur 36 yrs old   not sure if it is still present.   PIH (pregnancy induced hypertension), antepartum 11/19/2010    OBJECTIVE:  There were no vitals taken for this visit. Physical Exam   ASSESSMENT/PLAN:  There are no diagnoses linked to this encounter. No follow-ups on file. Bess Kinds, MD 01/02/2022, 7:13 AM PGY-***, [facility] {    This will disappear when note is signed, click to select method of visit    :1}

## 2022-01-02 NOTE — Patient Instructions (Incomplete)
It was great to see you! Thank you for allowing me to participate in your care!  I recommend that you always bring your medications to each appointment as this makes it easy to ensure we are on the correct medications and helps us not miss when refills are needed.  Our plans for today:  - *** -   We are checking some labs today, I will call you if they are abnormal will send you a MyChart message or a letter if they are normal.  If you do not hear about your labs in the next 2 weeks please let us know.***  Take care and seek immediate care sooner if you develop any concerns.   Dr. Nuri Larmer, MD [facility]  

## 2022-01-08 ENCOUNTER — Ambulatory Visit: Payer: Medicaid Other | Admitting: Student

## 2022-01-09 ENCOUNTER — Ambulatory Visit (INDEPENDENT_AMBULATORY_CARE_PROVIDER_SITE_OTHER): Payer: Medicaid Other | Admitting: Student

## 2022-01-09 ENCOUNTER — Encounter: Payer: Self-pay | Admitting: Student

## 2022-01-09 VITALS — BP 124/85 | HR 90 | Temp 98.7°F | Ht 65.0 in | Wt 148.4 lb

## 2022-01-09 DIAGNOSIS — G43809 Other migraine, not intractable, without status migrainosus: Secondary | ICD-10-CM | POA: Diagnosis not present

## 2022-01-09 DIAGNOSIS — G43909 Migraine, unspecified, not intractable, without status migrainosus: Secondary | ICD-10-CM | POA: Insufficient documentation

## 2022-01-09 DIAGNOSIS — F319 Bipolar disorder, unspecified: Secondary | ICD-10-CM | POA: Diagnosis not present

## 2022-01-09 DIAGNOSIS — M3130 Wegener's granulomatosis without renal involvement: Secondary | ICD-10-CM | POA: Diagnosis present

## 2022-01-09 NOTE — Patient Instructions (Signed)
It was great to see you! Thank you for allowing me to participate in your care!  Let's start working on your depression!  Our plans for today:  - Bipolar Depression  Go to Behavioral Health today  Make follow up appointment in 2 weeks with me  Go to therapist next week! See them as often as you like!  If you don't like your therapist, get a new one!  Don't give up on therapy!  - Wegner's  Make appointment to be seen by your Rheumatologist  -Migraine It sounds like your headache was a migraine. If it happens again, try tylenol or ibuprofen to treat it. You may need a bigger dose of medicine to treat the pain.  Avoid Goody's Powder if taking Ibuprofen.  Don't take daily. Can cause stomach ulcer and other medical issues If pain not controlled at home, can make appointment with clinic to be seen.  If pain to severe, can go to Emergency Room If experiencing headache with changes in vision, sensation, motor function, or extreme pain, go to Emergency Room   Take care and seek immediate care sooner if you develop any concerns.   Dr. Bess Kinds, MD [facility]

## 2022-01-09 NOTE — Assessment & Plan Note (Signed)
Patient notes still struggling with depression, sleep, and appetite, sometime wishing she was not around, but denying any SI/HI or plans to harm self/others. Patient notes her 36 yo son is what keeps her going, she won't leave him alone. Patient also notes she's been having issues with being more easily irritated than usual. Also notes that she is having paranoid thoughts, as she will watch TV and think they are talking about her. Patient has therapy appointment scheduled for next week. Patient unable to find alternate psychiatrist and plans to go to [facility] today for walk-in appt. -[facility] for bipolar depression management -F/u 2 weeks -See therapist next week

## 2022-01-09 NOTE — Assessment & Plan Note (Signed)
Patient to F/u with Rhem

## 2022-01-09 NOTE — Assessment & Plan Note (Signed)
Patient notes that she had a bad HA on 23rd, that was a pulsating pain located in her temples, aggravated by light. Patient was able to go to sleep and felt better afterwards. Patient was concerned about HA. Informed patient that this sounds most like a migraine and could come back. Encouraged patient to use OTC pain meds and to reach out to clinic/ED if pain unbearable/HA not going away.

## 2022-01-09 NOTE — Progress Notes (Signed)
  SUBJECTIVE:   CHIEF COMPLAINT / HPI:   Granulomatosis w/ Polyangiitis Patient to F/u w/ Rheum  Headache Had a HA Saturday that caused her to have to turn of lights from the pain. Had pain in temples, that was pulsating. Ended up going to sleep and woke up and felt better.   F/u depression and sleep -Patient seen 12/25/21 with notable depression and paranoia, -Off of meds for 3-4 months -Unsatisfied with [facility] & looking for provider  Patient Report's: Has an appointment w/ therapist next week.  Is planning on going to [facility] today Got some sleep last night, but sleep is still poor Reports feeling more irritated w/o reason or cause Does have thoughts of wishin she wasn't around, but denies any SI/HI and wants to be around to support her 67 yo son. Also report's going to church helps. Reports remote hx of suicide attmepts with pill overdose and jumping off of bridge, but denies any thoughts or plans today.    PERTINENT  PMH / PSH:    OBJECTIVE:  BP 124/85   Pulse 90   Temp 98.7 F (37.1 C)   Ht 5\' 5"  (1.651 m)   Wt 148 lb 6.4 oz (67.3 kg)   SpO2 97%   BMI 24.70 kg/m  Physical Exam Psychiatric:        Attention and Perception: Attention normal. She does not perceive auditory or visual hallucinations.        Mood and Affect: Affect is labile and tearful.        Speech: Speech normal.        Behavior: Behavior normal. Behavior is not agitated, slowed, aggressive, withdrawn, hyperactive or combative. Behavior is cooperative.        Thought Content: Thought content is paranoid.      ASSESSMENT/PLAN:  Granulomatosis with polyangiitis, unspecified whether renal involvement (HCC) Assessment & Plan: Patient to F/u with Rhem   Bipolar 1 disorder [facility]) Assessment & Plan: Patient notes still struggling with depression, sleep, and appetite, sometime wishing she was not around, but denying any SI/HI or plans to harm self/others. Patient notes her 65 yo son is what keeps her  going, she won't leave him alone. Patient also notes she's been having issues with being more easily irritated than usual. Also notes that she is having paranoid thoughts, as she will watch TV and think they are talking about her. Patient has therapy appointment scheduled for next week. Patient unable to find alternate psychiatrist and plans to go to [facility] today for walk-in appt. -[facility] for bipolar depression management -F/u 2 weeks -See therapist next week   Other migraine without status migrainosus, not intractable Assessment & Plan: Patient notes that she had a bad HA on 23rd, that was a pulsating pain located in her temples, aggravated by light. Patient was able to go to sleep and felt better afterwards. Patient was concerned about HA. Informed patient that this sounds most like a migraine and could come back. Encouraged patient to use OTC pain meds and to reach out to clinic/ED if pain unbearable/HA not going away.     No follow-ups on file. [facility], MD 01/09/2022, 7:04 PM PGY-2, [facility]

## 2022-01-21 NOTE — Patient Instructions (Signed)
It was great to see you! Thank you for allowing me to participate in your care!  I recommend that you always bring your medications to each appointment as this makes it easy to ensure we are on the correct medications and helps [place] not miss when refills are needed.  Our plans for today:  - Continue to go to [facility], Follow up with them March - Make appointment with Therapist As Soon As Possible - Make a follow up appointment with me for Mid February - Starting you on the Abilify they forgot to prescribe for you - Pick up meds prescribed by [facility] (Zoloft, Trazodone)  Take care and seek immediate care sooner if you develop any concerns.   Dr. Holley Bouche, MD [place]

## 2022-01-21 NOTE — Progress Notes (Signed)
SUBJECTIVE:   CHIEF COMPLAINT / HPI:   F/u bipolar depression Patient seen 12/26/21 and 12/ 28/23 w/ concerns of insomnia, paranoia, sadness, and decreased sleep Patient off of meds for 3-4 months Patient was looking for new Psychiatrist, but elected to return to [facility] for care at last visit.  Patient had found and was to start working w/ therapist Patient eval by [facility] on 01/24/22 noted to have been using cocaine consistently over the past year, and off meds since last seen at [facility] in 2022.   Patient reports: Went to [facility] this morning, and her medication was refilled. She plans for pick up this week when she has money. Started on Abilify and Zoloft. Per char review, patient started back on Zoloft and trazodone. They said they would be starting all her previous meds, but neglected to restart the Abilify.   She is looking for new therapist, because thing got confusing and mixed up w/ the therpist that she was seeing previously seeing. She has decided to go w/ [facility] therapist.   Also notes she had poor interaction with staff at [facility], with people being rude/short.  Will call tomorrow for therapy.   Denies any SI/HI or plans to harm self. Still feeling paranoia, but has been listening to less TV and reading books to help. Still not sleeping regularly, up for 1-2 days and sleeping for 1 day or so.   Reports she is still using cocaine and the last time she used was 2 weeks ago.  PERTINENT  PMH / PSH: Bipolar disorder, cocaine abuse   OBJECTIVE:  BP 130/80   Pulse (!) 109   Temp 98.4 F (36.9 C)   Ht 5\' 5"  (1.651 m)   Wt 146 lb 3.2 oz (66.3 kg)   SpO2 98%   BMI 24.33 kg/m  Physical Exam Psychiatric:        Attention and Perception: Attention normal.        Mood and Affect: Mood normal. Mood is not anxious, depressed or elated. Affect is not blunt.        Speech: Speech normal. Speech is not rapid and pressured, delayed, slurred or tangential.        Behavior: Behavior normal. Behavior  is not agitated, slowed, aggressive or withdrawn. Behavior is cooperative.        Thought Content: Thought content is paranoid.      ASSESSMENT/PLAN:  Bipolar 1 disorder [facility]) Assessment & Plan: Patient presents for f/u of her bipolar disorder. Report's she went to [facility] today, was displeased with the experience, but that they were restarting her depression meds. She has not started meds yet, as she is waiting for later in the week, when she can afford them. Patient notes she continues to have issues w/ paranoia and depression, as well as poor sleep. Patient denies any SI/HI/ plans to harm self, and does not appear to be responding to internal stimuli. Patient does note that she has continued to use cocaine, and last use was 2 wks ago.  Per chart review, provider noticed they planned to refill previous meds (zoloft, Abilify), but they wrote Rx for zoloft and trazodone. Provider will refill abilify and have patient f/u with them in 6 wk, early march, as recommended. Patient also note's she is trying to find a new therapist, but plans on using the [facility] therapy resources. Will f/u with patient before next [place] appt, to be sure meds have been started and patient has found therapist.  -Start Zoloft, Trazodone -Start Abilify -  Schedule appt w/ [facility] therapist -F/u mid February   Other orders -     ARIPiprazole; Take 1 tablet (5 mg total) by mouth daily.  Dispense: 30 tablet; Refill: 2   No follow-ups on file. Bess Kinds, MD 02/04/2022, 5:36 PM PGY-2, [facility]

## 2022-01-24 ENCOUNTER — Ambulatory Visit (INDEPENDENT_AMBULATORY_CARE_PROVIDER_SITE_OTHER): Payer: Medicaid Other | Admitting: Clinical

## 2022-01-24 DIAGNOSIS — F333 Major depressive disorder, recurrent, severe with psychotic symptoms: Secondary | ICD-10-CM

## 2022-01-24 DIAGNOSIS — F1420 Cocaine dependence, uncomplicated: Secondary | ICD-10-CM
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
## 2022-01-29 ENCOUNTER — Ambulatory Visit (HOSPITAL_COMMUNITY): Payer: Medicaid Other | Admitting: Physician Assistant

## 2022-02-04 ENCOUNTER — Encounter: Payer: Self-pay | Admitting: Student

## 2022-02-04 ENCOUNTER — Encounter (HOSPITAL_COMMUNITY): Payer: Self-pay | Admitting: Physician Assistant

## 2022-02-04 ENCOUNTER — Ambulatory Visit (INDEPENDENT_AMBULATORY_CARE_PROVIDER_SITE_OTHER): Payer: Medicaid Other | Admitting: Student

## 2022-02-04 ENCOUNTER — Ambulatory Visit (INDEPENDENT_AMBULATORY_CARE_PROVIDER_SITE_OTHER): Payer: Medicaid Other | Admitting: Physician Assistant

## 2022-02-04 VITALS — BP 130/80 | HR 109 | Temp 98.4°F | Ht 65.0 in | Wt 146.2 lb

## 2022-02-04 DIAGNOSIS — F1994 Other psychoactive substance use, unspecified with psychoactive substance-induced mood disorder: Secondary | ICD-10-CM

## 2022-02-04 DIAGNOSIS — F333 Major depressive disorder, recurrent, severe with psychotic symptoms: Secondary | ICD-10-CM

## 2022-02-04 DIAGNOSIS — F1420 Cocaine dependence, uncomplicated: Secondary | ICD-10-CM

## 2022-02-04 DIAGNOSIS — F319 Bipolar disorder, unspecified: Secondary | ICD-10-CM | POA: Diagnosis present

## 2022-02-04 MED ORDER — TRAZODONE HCL 50 MG PO TABS: 50.0000 mg | ORAL_TABLET | Freq: Every day | ORAL | 1 refills | Status: DC

## 2022-02-04 MED ORDER — ARIPIPRAZOLE 5 MG PO TABS: 5.0000 mg | ORAL_TABLET | Freq: Every day | ORAL | 2 refills | Status: DC

## 2022-02-04 MED ORDER — SERTRALINE HCL 50 MG PO TABS: 50.0000 mg | ORAL_TABLET | Freq: Every day | ORAL | 3 refills | Status: DC

## 2022-02-04 NOTE — Progress Notes (Signed)
Psychiatric Initial Adult Assessment   Patient Identification: Sandra Lee MRN:  [id number] Date of Evaluation:  02/04/2022 Referral Source: N/A Chief Complaint:   Chief Complaint  Patient presents with   Medication Management   Visit Diagnosis:    ICD-10-CM   1. Severe episode of recurrent major depressive disorder, with psychotic features (HCC)  F33.3 sertraline (ZOLOFT) 50 MG tablet    traZODone (DESYREL) 50 MG tablet    2. Cocaine use disorder, moderate, dependence (HCC)  F14.20     3. Substance induced mood disorder (HCC)  F19.94 traZODone (DESYREL) 50 MG tablet      History of Present Illness:    Sandra Lee is a 37 year old, African-American female with a past psychiatric history significant for substance induced mood disorder, major depressive disorder, and cocaine abuse who presents to [facility] for medication management.  That she has been dealing with depression and would like to get back on her medications.  Patient reports that she has been depressed for too long stating that she has been depressed for more than a year.  Patient rates her depression a 10 out of 10 with 10 being most severe.  She reports that she is bothered by her depression 7 days a week.  Patient endorses the following depressive symptoms: sadness, crying, attitude, taking her frustrations out on others, and difficulty eating and sleeping.  Patient reports that her depression is worsened by waking up.  Prior to running out of her medications, patient believes that she was taking sertraline, trazodone, and Abilify.  Patient reports that she last took her medications roughly 6 months ago.  In addition to depression, patient endorses anxiety she rates an 8 out of 10.  Patient states that everything stresses her out.  Patient also endorses panic attacks stating that her last panic attack occurred Saturday.  Patient denied any discernible trigger to her panic  attack.  Patient states that her panic attack was characterized by the following symptoms: out of breath irritability, and snapping out.  While assessing the patient, patient became irritable and asked how much longer the encounter would take.  Provider informed the patient that all the questions have to be gone over before patient to receive her medications.  Patient became irate stating that the information needed for the encounter could be found in her chart.  Provider informed the patient that information must be as accurate as possible and therefore must be obtained from her during the encounter.  While asking the remaining questions, patient became limited verbally and refused to cooperate and provide accurate information/history.  Associated Signs/Symptoms: Depression Symptoms:  depressed mood, anhedonia, insomnia, hypersomnia, psychomotor agitation, fatigue, feelings of worthlessness/guilt, difficulty concentrating, hopelessness, impaired memory, anxiety, loss of energy/fatigue, disturbed sleep, decreased appetite, (Hypo) Manic Symptoms:  Delusions, Distractibility, Elevated Mood, Flight of Ideas, Irritable Mood, Labiality of Mood, Anxiety Symptoms:   During the latter half of the encounter, patient became limited verbally and refused to cooperate and provide accurate information/history. Psychotic Symptoms:  Hallucinations: Auditory Paranoia, Patient states that she last experienced auditory hallucinations during New Year's.  Patient states that she is unable to watch TV or listening to the radio without thinking that they are talking about her PTSD Symptoms: Had a traumatic exposure:  Patient reports that she has many traumatic experiences but is unable to name them. Had a traumatic exposure in the last month:  N/A Re-experiencing:  Flashbacks Hypervigilance:  Yes Hyperarousal:  Difficulty Concentrating Emotional Numbness/Detachment Increased Startle  Response Sleep Avoidance:  None  Past Psychiatric History:  Per chart review, patient has a past psychiatric history significant for major depressive disorder (severe, recurrent, with psychotic features) and substance-induced mood disorder  Previous Psychotropic Medications: Yes   Substance Abuse History in the last 12 months:  Yes.    Consequences of Substance Abuse: During the latter half of the encounter, patient became limited verbally and refused to cooperate and provide accurate information/history. Medical Consequences:  Patient has been admitted to [facility] due to substance use Legal Consequences:  Not noted Family Consequences:  Not noted Blackouts:  Not noted DT's: Not noted Withdrawal Symptoms:   Unable to obtain  Past Medical History:  Past Medical History:  Diagnosis Date   Abnormal Pap smear    Depression    hx of depression - not taking medication   Heart murmur 37 yrs old   not sure if it is still present.   PIH (pregnancy induced hypertension), antepartum 11/19/2010   History reviewed. No pertinent surgical history.  Family Psychiatric History:  During the latter half of the encounter, patient became limited verbally and refused to cooperate and provide accurate information/history.  When asked about any psychiatric issues related to her family, patient responded with "I don't know" or "I have no clue."  Family History:  Family History  Problem Relation Age of Onset   Depression Mother    Early death Father        HIV   Arthritis Sister    Diabetes Maternal Aunt    Early death Maternal Aunt    Alcohol abuse Maternal Uncle    Diabetes Maternal Uncle    Diabetes Paternal Aunt    Diabetes Paternal Uncle    Arthritis Maternal Grandmother    Diabetes Maternal Grandmother    Heart disease Maternal Grandmother    Stroke Maternal Grandmother    Diabetes Maternal Grandfather     Social History:   Social History   Socioeconomic History   Marital status:  Single    Spouse name: Not on file   Number of children: Not on file   Years of education: Not on file   Highest education level: Not on file  Occupational History   Not on file  Tobacco Use   Smoking status: Every Day    Packs/day: 0.50    Types: Cigarettes   Smokeless tobacco: Never  Vaping Use   Vaping Use: Never used  Substance and Sexual Activity   Alcohol use: No   Drug use: Not Currently    Types: Cocaine   Sexual activity: Yes    Birth control/protection: Implant  Other Topics Concern   Not on file  Social History Narrative   Not on file   Social Determinants of Health   Financial Resource Strain: Not on file  Food Insecurity: Not on file  Transportation Needs: Not on file  Physical Activity: Not on file  Stress: Not on file  Social Connections: Not on file    Additional Social History:  During the latter half of the encounter, patient became limited verbally and refused to cooperate and provide accurate information/history.  Allergies:   Allergies  Allergen Reactions   Penicillins Other (See Comments)    Childhood reaction patient unsure of allergic response  Did it involve swelling of the face/tongue/throat, SOB, or low BP? Unknown Did it involve sudden or severe rash/hives, skin peeling, or any reaction on the inside of your mouth or nose? Unknown Did you need to seek medical  attention at a hospital or doctor's office? Unknown When did it last happen?      childhood If all above answers are "NO", may proceed with cephalosporin use.      Metabolic Disorder Labs: Lab Results  Component Value Date   HGBA1C 5.9 (H) 08/15/2020   MPG 122.63 08/15/2020   Lab Results  Component Value Date   PROLACTIN 3.5 (L) 08/15/2020   Lab Results  Component Value Date   CHOL 175 08/15/2020   TRIG 93 08/15/2020   HDL 50 08/15/2020   CHOLHDL 3.5 08/15/2020   VLDL 19 08/15/2020   LDLCALC 106 (H) 08/15/2020   Lab Results  Component Value Date   TSH 2.130  06/13/2021    Therapeutic Level Labs: No results found for: "LITHIUM" No results found for: "CBMZ" No results found for: "VALPROATE"  Current Medications: Current Outpatient Medications  Medication Sig Dispense Refill   traZODone (DESYREL) 50 MG tablet Take 1 tablet (50 mg total) by mouth at bedtime. 30 tablet 1   ARIPiprazole (ABILIFY) 5 MG tablet Take 1 tablet (5 mg total) by mouth daily. 30 tablet 2   calcium carbonate (OSCAL) 1500 (600 Ca) MG TABS tablet Take 1 tablet by mouth in the morning and at bedtime. (Patient not taking: Reported on 12/25/2021)     etonogestrel (NEXPLANON) 68 MG IMPL implant 68 mg by Subdermal route once.      hydrochlorothiazide (HYDRODIURIL) 25 MG tablet TAKE 1 TABLET(25 MG) BY MOUTH DAILY 90 tablet 0   losartan (COZAAR) 25 MG tablet TAKE 2 TABLETS BY MOUTH DAILY 90 tablet 1   methotrexate 50 MG/2ML injection Inject 25 mg into the skin every Wednesday.     mupirocin ointment (BACTROBAN) 2 % Apply 1 application. topically 2 (two) times daily. Place 1 inch strip in saline mixture and shake well to mix, rinse bilateral nasal cavities twice daily 30 g 2   nicotine (NICODERM CQ - DOSED IN MG/24 HOURS) 21 mg/24hr patch Place 1 patch (21 mg total) onto the skin daily. 28 patch 0   predniSONE (DELTASONE) 10 MG tablet Take 40 mg by mouth daily with breakfast.     sertraline (ZOLOFT) 50 MG tablet Take 1 tablet (50 mg total) by mouth daily. 30 tablet 3   No current facility-administered medications for this visit.    Musculoskeletal: Strength & Muscle Tone: within normal limits Gait & Station: normal Patient leans: N/A  Psychiatric Specialty Exam: Review of Systems  Psychiatric/Behavioral:  Positive for agitation, dysphoric mood and sleep disturbance. Negative for decreased concentration, hallucinations, self-injury and suicidal ideas. The patient is nervous/anxious. The patient is not hyperactive.     There were no vitals taken for this visit.There is no height  or weight on file to calculate BMI.  General Appearance: Fairly Groomed  Eye Contact:  Minimal  Speech:  Clear and Coherent and Normal Rate  Volume:  Normal  Mood:  Anxious, Depressed, and Irritable  Affect:  Congruent and Tearful  Thought Process:  Coherent, Goal Directed, and Descriptions of Associations: Intact  Orientation:  Full (Time, Place, and Person)  Thought Content:  WDL  Suicidal Thoughts:   Patient replies with "I don't know."  Homicidal Thoughts:   Patient replies with "I don't know."  Memory:  Immediate;   Good Recent;   Fair Remote;   Fair  Judgement:  Fair  Insight:  Fair  Psychomotor Activity:  Normal  Concentration:  Concentration: Good and Attention Span: Good  Recall:  [employer] of  Knowledge:Fair  Language: Good  Akathisia:  No  Handed:  Right  AIMS (if indicated):  not done  Assets:  Communication Skills Desire for Improvement Financial Resources/Insurance Vocational/Educational  ADL's:  Intact  Cognition: WNL  Sleep:  Poor   Screenings: GAD-7    Flowsheet Row Counselor from 01/24/2022 in [facility] Office Visit from 09/10/2020 in [facility]  Total GAD-7 Score 21 6      PHQ2-9    Flowsheet Row Counselor from 01/24/2022 in [facility] Office Visit from 01/09/2022 in [place] Office Visit from 12/25/2021 in [place] Office Visit from 06/13/2021 in [place] Office Visit from 06/06/2021 in [place]  PHQ-2 Total Score 6 5 6 4 6   PHQ-9 Total Score 23 18 25 18 21       Flowsheet Row ED from 05/27/2021 in [facility] at [facility] ED from 08/15/2020 in [place] CATEGORY High Risk No Risk       Assessment and Plan:   Shianne Zeiser is a 37 year old, African-American female with a past  psychiatric history significant for substance induced mood disorder, major depressive disorder, and cocaine abuse who presents to [facility] for medication management.  Patient reports that she is struggling with depression, anxiety, and states that she recently had a panic attack on Saturday.  Patient has been seen at our [facility] in the past for substance induced mood disorder.  During the encounter, patient became irritated about the questions addressed to her and requested for the provider to look in her chart for all the information.  Provider informed patient that all of the questions must be gone over prior to patient receiving her medications.  Patient proceeded to be uncooperative and verbally limited answering most questions with "I don't know."  Per chart review, patient has a history of taking Zoloft 50 mg daily and Abilify 5 mg daily for the management of her major depressive disorder and substance induced mood disorder.  Patient to be placed back on her regularly prescribed medications as well as trazodone 50 mg at bedtime as needed.  Patient's medications to be e-prescribed to pharmacy of choice.  Collaboration of Care: Medication Management AEB provider managing patient's psychiatric medications, Primary Care Provider AEB patient being seen by a family medicine doctor, and Psychiatrist AEB patient being seen by a mental health provider  Patient/Guardian was advised Release of Information must be obtained prior to any record release in order to collaborate their care with an outside provider. Patient/Guardian was advised if they have not already done so to contact the registration department to sign all necessary forms in order for [place] to release information regarding their care.   Consent: Patient/Guardian gives verbal consent for treatment and assignment of benefits for services provided during this visit. Patient/Guardian expressed understanding and  agreed to proceed.   1. Severe episode of recurrent major depressive disorder, with psychotic features ([place])  - sertraline (ZOLOFT) 50 MG tablet; Take 1 tablet (50 mg total) by mouth daily.  Dispense: 30 tablet; Refill: 3 - traZODone (DESYREL) 50 MG tablet; Take 1 tablet (50 mg total) by mouth at bedtime.  Dispense: 30 tablet; Refill: 1  2. Cocaine use disorder, moderate, dependence (HCC)   3. Substance induced mood disorder (HCC)  - traZODone (DESYREL) 50 MG tablet; Take  1 tablet (50 mg total) by mouth at bedtime.  Dispense: 30 tablet; Refill: 1  Patient to follow-up in 6 weeks Provider spent a total of 35 minutes with the patient/reviewing patient's chart  Malachy Mood, PA 1/23/20241:51 PM

## 2022-02-04 NOTE — Assessment & Plan Note (Addendum)
Patient presents for f/u of her bipolar disorder. Report's she went to [facility] today, was displeased with the experience, but that they were restarting her depression meds. She has not started meds yet, as she is waiting for later in the week, when she can afford them. Patient notes she continues to have issues w/ paranoia and depression, as well as poor sleep. Patient denies any SI/HI/ plans to harm self, and does not appear to be responding to internal stimuli. Patient does note that she has continued to use cocaine, and last use was 2 wks ago.  Per chart review, provider noticed they planned to refill previous meds (zoloft, Abilify), but they wrote Rx for zoloft and trazodone. Provider will refill abilify and have patient f/u with them in 6 wk, early march, as recommended. Patient also note's she is trying to find a new therapist, but plans on using the [facility] therapy resources. Will f/u with patient before next [place] appt, to be sure meds have been started and patient has found therapist.  -Start Zoloft, Trazodone -Start Abilify -Schedule appt w/ [facility] therapist -F/u mid February

## 2022-02-19 ENCOUNTER — Other Ambulatory Visit: Payer: Self-pay

## 2022-02-19 ENCOUNTER — Emergency Department (HOSPITAL_COMMUNITY)
Admission: EM | Admit: 2022-02-19 | Discharge: 2022-02-22 | Disposition: A | Discharge status: Other Acute Inpatient Hospital | Payer: Medicaid Other | Attending: Emergency Medicine | Admitting: Emergency Medicine

## 2022-02-19 DIAGNOSIS — F1420 Cocaine dependence, uncomplicated: Secondary | ICD-10-CM | POA: Diagnosis not present

## 2022-02-19 DIAGNOSIS — F419 Anxiety disorder, unspecified: Secondary | ICD-10-CM | POA: Diagnosis not present

## 2022-02-19 DIAGNOSIS — Z1152 Encounter for screening for COVID-19: Secondary | ICD-10-CM | POA: Insufficient documentation

## 2022-02-19 DIAGNOSIS — F333 Major depressive disorder, recurrent, severe with psychotic symptoms: Secondary | ICD-10-CM | POA: Diagnosis present

## 2022-02-19 DIAGNOSIS — F23 Brief psychotic disorder: Secondary | ICD-10-CM | POA: Insufficient documentation

## 2022-02-19 DIAGNOSIS — E876 Hypokalemia: Secondary | ICD-10-CM | POA: Insufficient documentation

## 2022-02-19 LAB — COMPREHENSIVE METABOLIC PANEL WITH GFR
ALT: 19 U/L (ref 0–44)
AST: 27 U/L (ref 15–41)
Albumin: 4.6 g/dL (ref 3.5–5.0)
Alkaline Phosphatase: 82 U/L (ref 38–126)
Anion gap: 11 (ref 5–15)
BUN: 8 mg/dL (ref 6–20)
CO2: 26 mmol/L (ref 22–32)
Calcium: 9.8 mg/dL (ref 8.9–10.3)
Chloride: 103 mmol/L (ref 98–111)
Creatinine, Ser: 0.84 mg/dL (ref 0.44–1.00)
GFR, Estimated: >60 mL/min
Glucose, Bld: 142 mg/dL — ABNORMAL HIGH (ref 70–99)
Potassium: 2.9 mmol/L — ABNORMAL LOW (ref 3.5–5.1)
Sodium: 140 mmol/L (ref 135–145)
Total Bilirubin: 0.5 mg/dL (ref 0.3–1.2)
Total Protein: 8.4 g/dL — ABNORMAL HIGH (ref 6.5–8.1)

## 2022-02-19 LAB — CBC
HCT: 41.4 % (ref 36.0–46.0)
Hemoglobin: 13.8 g/dL (ref 12.0–15.0)
MCH: 26.2 pg (ref 26.0–34.0)
MCHC: 33.3 g/dL (ref 30.0–36.0)
MCV: 78.6 fL — ABNORMAL LOW (ref 80.0–100.0)
Platelets: 329 10*3/uL (ref 150–400)
RBC: 5.27 MIL/uL — ABNORMAL HIGH (ref 3.87–5.11)
RDW: 14.2 % (ref 11.5–15.5)
WBC: 8.8 10*3/uL (ref 4.0–10.5)
nRBC: 0 % (ref 0.0–0.2)

## 2022-02-19 LAB — I-STAT BETA HCG BLOOD, ED (MC, WL, AP ONLY): I-stat hCG, quantitative: <5 m[IU]/mL (ref <5)

## 2022-02-19 LAB — RAPID URINE DRUG SCREEN, HOSP PERFORMED
Amphetamines: NOT DETECTED
Barbiturates: NOT DETECTED
Benzodiazepines: NOT DETECTED
Cocaine: POSITIVE — AB
Opiates: NOT DETECTED
Tetrahydrocannabinol: POSITIVE — AB

## 2022-02-19 LAB — SALICYLATE LEVEL: Salicylate Lvl: <7 mg/dL — ABNORMAL LOW (ref 7.0–30.0)

## 2022-02-19 LAB — ETHANOL: Alcohol, Ethyl (B): <10 mg/dL (ref <10)

## 2022-02-19 LAB — ACETAMINOPHEN LEVEL: Acetaminophen (Tylenol), Serum: <10 ug/mL — ABNORMAL LOW (ref 10–30)

## 2022-02-19 MED ORDER — LORAZEPAM 1 MG PO TABS
2.0000 mg | ORAL_TABLET | Freq: Once | ORAL | Status: AC
  Administered 2022-02-19: 2 mg via ORAL
  Filled 2022-02-19 (×2): qty 2

## 2022-02-19 MED ORDER — POTASSIUM CHLORIDE 10 MEQ/100ML IV SOLN
10.0000 meq | INTRAVENOUS | Status: DC
  Filled 2022-02-19: qty 100

## 2022-02-19 MED ORDER — ZIPRASIDONE MESYLATE 20 MG IM SOLR
20.0000 mg | Freq: Once | INTRAMUSCULAR | Status: AC
  Administered 2022-02-19: 20 mg via INTRAMUSCULAR
  Filled 2022-02-19: qty 20

## 2022-02-19 MED ORDER — POTASSIUM CHLORIDE CRYS ER 20 MEQ PO TBCR
40.0000 meq | EXTENDED_RELEASE_TABLET | Freq: Once | ORAL | Status: AC
  Administered 2022-02-19: 40 meq via ORAL
  Filled 2022-02-19: qty 2

## 2022-02-19 NOTE — ED Notes (Signed)
Pt brought back in by police screaming and crying.

## 2022-02-19 NOTE — ED Notes (Signed)
Patient refused all care   she pushed her way thru all security   security was informed that she was being IVC'd   once security and law enforcement bring her back into the building she will be going to [place] [facility]

## 2022-02-19 NOTE — ED Notes (Signed)
While being triaged pt threw their phone into the trash. Phone was retrieved by RN.

## 2022-02-19 NOTE — ED Provider Notes (Signed)
Attempted to evaluate patient, patient walked out of ED and police were looking for her, asked RN to message me if she returns.

## 2022-02-19 NOTE — ED Notes (Signed)
PA made aware of potassium of 2.9.

## 2022-02-19 NOTE — ED Notes (Addendum)
Pt belonging bag behind station 16[address]22, black purse and phone with broken screen, wig and wig cap, black shoes, black and pink jacket

## 2022-02-19 NOTE — ED Notes (Signed)
Dr. Oswald Hillock at bedside, advised staff he was IVC'ing pt and would order IM medications

## 2022-02-19 NOTE — ED Notes (Signed)
Patient vacated property   security and PD are looking for her   IVC papers have been taken out for if an when they find and bring her back  ativan was returned to [facility] by [place]

## 2022-02-19 NOTE — ED Triage Notes (Signed)
Pt arrived via POV. Pt stating they are having a mental breakdown. No thoughts of hurting themselves or others. Pt stating they are hearing and seeing things.  Pt reports they have been non compliant with their meds.  AOx4

## 2022-02-19 NOTE — ED Notes (Signed)
Per PA Le, ok to give PO ativan.  Given per order.

## 2022-02-19 NOTE — ED Notes (Signed)
Pt found by [employer] and notified triage. I went out to assist and found pt very manic, screaming nonsensical statements. Pt attempted to fight police and was detained. Pt brought back to [place] [facility] and IM Geodon given, see MAR. Pt continues to try and fight staff. PA notified of need for violent restraints.

## 2022-02-19 NOTE — ED Provider Notes (Signed)
[place] EMERGENCY DEPARTMENT AT [facility] Provider Note   CSN: [id number] Arrival date & time: 02/19/22  1440     History {Add pertinent medical, surgical, social history, OB history to HPI:1} Chief Complaint  Patient presents with   Anxiety    Sandra Lee is a 37 y.o. female with a past medical history of depression, bipolar disorder presenting today for evaluation of anxiety.  Patient states she is having a mental breakdown.  States she wanted to "keep my child safe".  She denies thought of hurting herself or others.  She states she was hearing and seeing things.  Patient has not been compliant with her medications.  She was tearful during my examination and questioning why she needs an IV.  States "I just want to get help" repeatedly.   Anxiety    Past Medical History:  Diagnosis Date   Abnormal Pap smear    Depression    hx of depression - not taking medication   Heart murmur 37 yrs old   not sure if it is still present.   PIH (pregnancy induced hypertension), antepartum 11/19/2010   No past surgical history on file.   Home Medications Prior to Admission medications   Medication Sig Start Date End Date Taking? Authorizing Provider  ARIPiprazole (ABILIFY) 5 MG tablet Take 1 tablet (5 mg total) by mouth daily. 02/04/22   Holley Bouche, MD  calcium carbonate (OSCAL) 1500 (600 Ca) MG TABS tablet Take 1 tablet by mouth in the morning and at bedtime. Patient not taking: Reported on 12/25/2021    [provider]  etonogestrel (NEXPLANON) 68 MG IMPL implant 68 mg by Subdermal route once.     [provider]  hydrochlorothiazide (HYDRODIURIL) 25 MG tablet TAKE 1 TABLET(25 MG) BY MOUTH DAILY 10/08/21   Holley Bouche, MD  losartan (COZAAR) 25 MG tablet TAKE 2 TABLETS BY MOUTH DAILY 11/25/21   Holley Bouche, MD  methotrexate 50 MG/2ML injection Inject 25 mg into the skin every Wednesday. 07/27/20   [provider]  mupirocin ointment  (BACTROBAN) 2 % Apply 1 application. topically 2 (two) times daily. Place 1 inch strip in saline mixture and shake well to mix, rinse bilateral nasal cavities twice daily 06/13/21   Holley Bouche, MD  nicotine (NICODERM CQ - DOSED IN MG/24 HOURS) 21 mg/24hr patch Place 1 patch (21 mg total) onto the skin daily. 05/29/21   Delfin Gant, NP  predniSONE (DELTASONE) 10 MG tablet Take 40 mg by mouth daily with breakfast.    [provider]  sertraline (ZOLOFT) 50 MG tablet Take 1 tablet (50 mg total) by mouth daily. 02/04/22   Nwoko, Terese Door, PA  traZODone (DESYREL) 50 MG tablet Take 1 tablet (50 mg total) by mouth at bedtime. 02/04/22   Malachy Mood, PA      Allergies    Penicillins    Review of Systems   Review of Systems Negative except as per HPI.  Physical Exam Updated Vital Signs BP (!) 143/109   Pulse 82   Temp 99.6 F (37.6 C)   Resp 18   SpO2 100%  Physical Exam Vitals and nursing note reviewed.  Constitutional:      Appearance: Normal appearance.  HENT:     Head: Normocephalic and atraumatic.     Mouth/Throat:     Mouth: Mucous membranes are moist.  Eyes:     General: No scleral icterus. Cardiovascular:     Rate and Rhythm: Normal rate  and regular rhythm.     Pulses: Normal pulses.     Heart sounds: Normal heart sounds.  Pulmonary:     Effort: Pulmonary effort is normal.     Breath sounds: Normal breath sounds.  Abdominal:     General: Abdomen is flat.     Palpations: Abdomen is soft.     Tenderness: There is no abdominal tenderness.  Musculoskeletal:        General: No deformity.  Skin:    General: Skin is warm.     Findings: No rash.  Neurological:     General: No focal deficit present.     Mental Status: She is alert.  Psychiatric:     Comments: Patient is tearful, appears pressured, agitated and disorganized in speech during my interview.     ED Results / Procedures / Treatments   Labs (all labs ordered are listed, but only  abnormal results are displayed) Labs Reviewed  COMPREHENSIVE METABOLIC PANEL - Abnormal; Notable for the following components:      Result Value   Potassium 2.9 (*)    Glucose, Bld 142 (*)    Total Protein 8.4 (*)    All other components within normal limits  SALICYLATE LEVEL - Abnormal; Notable for the following components:   Salicylate Lvl <8.9 (*)    All other components within normal limits  ACETAMINOPHEN LEVEL - Abnormal; Notable for the following components:   Acetaminophen (Tylenol), Serum <10 (*)    All other components within normal limits  CBC - Abnormal; Notable for the following components:   RBC 5.27 (*)    MCV 78.6 (*)    All other components within normal limits  RAPID URINE DRUG SCREEN, HOSP PERFORMED - Abnormal; Notable for the following components:   Cocaine POSITIVE (*)    Tetrahydrocannabinol POSITIVE (*)    All other components within normal limits  ETHANOL  I-STAT BETA HCG BLOOD, ED ([facility], [facility], [facility] ONLY)    EKG None  Radiology No results found.  Procedures Procedures  {Document cardiac monitor, telemetry assessment procedure when appropriate:1}  Medications Ordered in ED Medications  LORazepam (ATIVAN) tablet 2 mg (2 mg Oral Given 02/19/22 2053)  ziprasidone (GEODON) injection 20 mg (20 mg Intramuscular Given 02/19/22 1857)  potassium chloride SA (KLOR-CON M) CR tablet 40 mEq (40 mEq Oral Given 02/19/22 2048)    ED Course/ Medical Decision Making/ A&P Clinical Course as of 02/19/22 2143  Wed Feb 19, 2022  1711 At bedside.  Patient grossly disjointed.  Nonlinear and speech, pressured, responding to internal stimuli and grossly disorganized in speech. Uncertain if substance-induced psychosis or primary underlying psychiatric disorder.  Favor substance-induced due to enlarged pupils and positive cocaine though patient cannot provide any collateral history due to her agitation at this time.  Will be IVC, arranged for psychiatric evaluation and reassessed for  clinical improvement. Will treat with p.o. medications for suspected sympathomimetic intoxication with Ativan, patient consented verbally. [CC]  1811 I was informed that patient had eloped from the department despite IVC.  Police engaged. [CC]    Clinical Course User Index [CC] Tretha Sciara, MD   {   Click here for ABCD2, HEART and other calculatorsREFRESH Note before signing :1}                          Medical Decision Making Amount and/or Complexity of Data Reviewed Labs: ordered.  Risk Prescription drug management.   This patient presents to the  ED for anxiety, this involves an extensive number of treatment options, and is a complaint that carries with a high risk of complications and morbidity.  The differential diagnosis includes psychosis, bipolar disorder, panic attack, electrolyte abnormalities, intoxication, infectious etiology, overdose.  This is not an exhaustive list.  Lab tests: I ordered and personally interpreted labs.  The pertinent results include: WBC unremarkable. Hbg unremarkable. Platelets unremarkable. Hypokalemia 2.9 noted. BUN, creatinine unremarkable.  Urine drug screen positive for cocaine and tetrahydrocannabinol.  Problem list/ ED course/ Critical interventions/ Medical management: HPI: See above Vital signs within normal range and stable throughout visit. Laboratory/imaging studies significant for: See above. On physical examination, patient is afebrile and appears in no acute distress. This patient presents with symptoms consistent with an underlying psychiatric disorder, most likely psychosis vs bipolar disorder. Presentation not consistent with acute organic causes to include delirium, dementia or drug induced disorders (acute ingestions or withdrawal; no evidence of toxidrome). Given the H&P, aggressive behaviors I so think this patient needed to be IVC. Safety restraints, Ativan and Geodon ordered. PO potassium ordered given K level of 2.9.   Psychiatry was consulted and continued patient's hold. Patient was medically cleared and transferred to psychiatric care. I have reviewed the patient home medicines and have made adjustments as needed.  Cardiac monitoring/EKG: The patient was maintained on a cardiac monitor.  I personally reviewed and interpreted the cardiac monitor which showed an underlying rhythm of: sinus rhythm.  Additional history obtained: External records from outside source obtained and reviewed including: Chart review including previous notes, labs, imaging.  Disposition Patient is placed on psych hold for further evaluation and management.  This chart was dictated using voice recognition software.  Despite best efforts to proofread,  errors can occur which can change the documentation meaning.    {Document critical care time when appropriate:1} {Document review of labs and clinical decision tools ie heart score, Chads2Vasc2 etc:1}  {Document your independent review of radiology images, and any outside records:1} {Document your discussion with family members, caretakers, and with consultants:1} {Document social determinants of health affecting pt's care:1} {Document your decision making why or why not admission, treatments were needed:1} Final Clinical Impression(s) / ED Diagnoses Final diagnoses:  Brief psychotic disorder ([place])    Rx / DC Orders ED Discharge Orders     None

## 2022-02-20 DIAGNOSIS — F333 Major depressive disorder, recurrent, severe with psychotic symptoms: Secondary | ICD-10-CM

## 2022-02-20 MED ORDER — ARIPIPRAZOLE 5 MG PO TABS
5.0000 mg | ORAL_TABLET | Freq: Every day | ORAL | Status: DC
  Administered 2022-02-21 – 2022-02-22 (×2): 5 mg via ORAL
  Filled 2022-02-20 (×2): qty 1

## 2022-02-20 MED ORDER — CALCIUM CARBONATE 1250 (500 CA) MG PO TABS
1.0000 | ORAL_TABLET | Freq: Two times a day (BID) | ORAL | Status: DC
  Administered 2022-02-20 – 2022-02-22 (×4): 1250 mg via ORAL
  Filled 2022-02-20 (×5): qty 1

## 2022-02-20 MED ORDER — TRAZODONE HCL 50 MG PO TABS
50.0000 mg | ORAL_TABLET | Freq: Every day | ORAL | Status: DC
  Administered 2022-02-20 – 2022-02-21 (×2): 50 mg via ORAL
  Filled 2022-02-20 (×2): qty 1

## 2022-02-20 MED ORDER — SERTRALINE HCL 50 MG PO TABS
50.0000 mg | ORAL_TABLET | Freq: Every day | ORAL | Status: DC
  Administered 2022-02-20 – 2022-02-22 (×3): 50 mg via ORAL
  Filled 2022-02-20 (×3): qty 1

## 2022-02-20 NOTE — ED Notes (Signed)
Late note: Pt was released from restraints on 02/19/21 @ 22:35

## 2022-02-20 NOTE — Consult Note (Signed)
[facility] ED ASSESSMENT   Reason for Consult:  Agitation Referring Physician:  Dallie Piles, PA-C Patient Identification: GENELDA GIAMBRA MRN:  [id number] ED Chief Complaint: Severe episode of recurrent major depressive disorder, with psychotic features ([place])  Diagnosis:  Principal Problem:   Severe episode of recurrent major depressive disorder, with psychotic features ([place]) Active Problems:   Cocaine use disorder, moderate, dependence ([place])   ED Assessment Time Calculation: Start Time: 1500 Stop Time: 1545 Total Time in Minutes (Assessment Completion): 45   HPI:  Per Triage Note "Pt arrived via POV. Pt stating they are having a mental breakdown. No thoughts of hurting themselves or others. Pt stating they are hearing and seeing things. Pt reports they have been non compliant with their meds".  Subjective:  Sandra Lee, 37 y.o., female patient seen face to face by this provider, consulted with Dr. Dwyane Dee; and chart reviewed on 02/20/22.  On evaluation CARLIS GATZKE reports that she is ready to go home.  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  Patient denies SI/HI, but says that she hears and sees things.  Patient became extremely tearful saying that "I signed a contract and if I do not do certain things, they will rate my son".  Patient would not give provider any more details, just becoming extremely emotional and tearful.  Patient said that she has been hearing and seeing things for about a year, and states that something did trigger these hallucinations, but did not want to tell provider what happened.  Patient says lately her appetite and sleep are poor due to anxiety and stress.  Patient says she has a history of depression and bipolar, she has been admitted to several inpatient psychiatric facilities.  Patient says that he takes Zoloft and Abilify but has not been compliant with medications because she did not feel like  it.  During evaluation ANNAS SADOSKI is laying in her hospital bed, tearful in no acute distress.  She is alert and oriented x 4, she is calm and cooperative.  Her mood is sad with flat/ guarded affect.  She has normal speech and appropriate behavior.  Patient appears to have some thought blocking going on, as she is answering questions she takes a minute to pause, and then says "what did you say."  Patient does appear to have some paranoia behavior, and she feels that someone is out to get her and her son.  Patient denies SI/HI.  Patient UDS was positive for cocaine and THC, patient states that she uses it occasionally, denies using any other illicit substances or alcohol.  With patient's permission spoke with her grandmother Arnette Norris and she stated that patient has been hearing and seeing things that has been going on for quite a while, not sure how long, but she thinks people are out to get her.  Patient's grandmother says patient does pretty good for a while, but then its like something jumped on her and took over her body, and she becomes paranoid, "she is getting worse.  "   Past Psychiatric History: Bipolar, depression  Risk to Self or Others: Is the patient at risk to self? No Has the patient been a risk to self in the past 6 months? No Has the patient been a risk to self within the distant past? No Is the patient a risk to others? No Has the patient been a risk to others in  the past 6 months? No Has the patient been a risk to others within the distant past? No  [place] Scale:  [place] ED from 02/19/2022 in [facility] Emergency Department at [facility] ED from 05/27/2021 in [facility] Emergency Department at [facility] ED from 08/15/2020 in [place] No Risk High Risk No Risk       AIMS:  , , ,  ,   ASAM:    Substance Abuse:     Past Medical History:  Past Medical History:  Diagnosis Date    Abnormal Pap smear    Depression    hx of depression - not taking medication   Heart murmur 37 yrs old   not sure if it is still present.   PIH (pregnancy induced hypertension), antepartum 11/19/2010   No past surgical history on file. Family History:  Family History  Problem Relation Age of Onset   Depression Mother    Early death Father        HIV   Arthritis Sister    Diabetes Maternal Aunt    Early death Maternal Aunt    Alcohol abuse Maternal Uncle    Diabetes Maternal Uncle    Diabetes Paternal Aunt    Diabetes Paternal Uncle    Arthritis Maternal Grandmother    Diabetes Maternal Grandmother    Heart disease Maternal Grandmother    Stroke Maternal Grandmother    Diabetes Maternal Grandfather      Social History:  Social History   Substance and Sexual Activity  Alcohol Use No     Social History   Substance and Sexual Activity  Drug Use Not Currently   Types: Cocaine    Social History   Socioeconomic History   Marital status: Single    Spouse name: Not on file   Number of children: Not on file   Years of education: Not on file   Highest education level: Not on file  Occupational History   Not on file  Tobacco Use   Smoking status: Every Day    Packs/day: 0.50    Types: Cigarettes   Smokeless tobacco: Never  Vaping Use   Vaping Use: Never used  Substance and Sexual Activity   Alcohol use: No   Drug use: Not Currently    Types: Cocaine   Sexual activity: Yes    Birth control/protection: Implant  Other Topics Concern   Not on file  Social History Narrative   Not on file   Social Determinants of Health   Financial Resource Strain: Not on file  Food Insecurity: Not on file  Transportation Needs: Not on file  Physical Activity: Not on file  Stress: Not on file  Social Connections: Not on file      Allergies:   Allergies  Allergen Reactions   Penicillins Other (See Comments)    Childhood reaction patient unsure of allergic response  Did  it involve swelling of the face/tongue/throat, SOB, or low BP? Unknown Did it involve sudden or severe rash/hives, skin peeling, or any reaction on the inside of your mouth or nose? Unknown Did you need to seek medical attention at a hospital or doctor's office? Unknown When did it last happen?      childhood If all above answers are "NO", may proceed with cephalosporin use.      Labs:  Results for orders placed or performed during the hospital encounter of 02/19/22 (from the past 48 hour(s))  I-Stat beta hCG blood, ED     Status: None   Collection Time: 02/19/22  3:35 PM  Result Value Ref Range   I-stat hCG, quantitative <5.0 <5 mIU/mL   Comment 3            Comment:   GEST. AGE      CONC.  (mIU/mL)   <=1 WEEK        5 - 50     2 WEEKS       50 - 500     3 WEEKS       100 - 10,000     4 WEEKS     1,000 - 30,000        FEMALE AND NON-PREGNANT FEMALE:     LESS THAN 5 mIU/mL   Rapid urine drug screen (hospital performed)     Status: Abnormal   Collection Time: 02/19/22  3:39 PM  Result Value Ref Range   Opiates NONE DETECTED NONE DETECTED   Cocaine POSITIVE (A) NONE DETECTED   Benzodiazepines NONE DETECTED NONE DETECTED   Amphetamines NONE DETECTED NONE DETECTED   Tetrahydrocannabinol POSITIVE (A) NONE DETECTED   Barbiturates NONE DETECTED NONE DETECTED    Comment: (NOTE) DRUG SCREEN FOR MEDICAL PURPOSES ONLY.  IF CONFIRMATION IS NEEDED FOR ANY PURPOSE, NOTIFY LAB WITHIN 5 DAYS.  LOWEST DETECTABLE LIMITS FOR URINE DRUG SCREEN Drug Class                     Cutoff (ng/mL) Amphetamine and metabolites    1000 Barbiturate and metabolites    200 Benzodiazepine                 200 Opiates and metabolites        300 Cocaine and metabolites        300 THC                            50 Performed at [facility], [place] [address], [place], [place] [address]   Comprehensive metabolic panel     Status: Abnormal   Collection Time: 02/19/22  3:40 PM  Result Value  Ref Range   Sodium 140 135 - 145 mmol/L   Potassium 2.9 (L) 3.5 - 5.1 mmol/L   Chloride 103 98 - 111 mmol/L   CO2 26 22 - 32 mmol/L   Glucose, Bld 142 (H) 70 - 99 mg/dL    Comment: Glucose reference range applies only to samples taken after fasting for at least 8 hours.   BUN 8 6 - 20 mg/dL   Creatinine, Ser 0.84 0.44 - 1.00 mg/dL   Calcium 9.8 8.9 - 10.3 mg/dL   Total Protein 8.4 (H) 6.5 - 8.1 g/dL   Albumin 4.6 3.5 - 5.0 g/dL   AST 27 15 - 41 U/L   ALT 19 0 - 44 U/L   Alkaline Phosphatase 82 38 - 126 U/L   Total Bilirubin 0.5 0.3 - 1.2 mg/dL   GFR, Estimated >60 >60 mL/min    Comment: (NOTE) Calculated using the CKD-EPI Creatinine Equation (2021)    Anion gap 11 5 - 15    Comment: Performed at [facility], [place] [address] [place],  [address]  Ethanol     Status: None   Collection Time: 02/19/22  3:40 PM  Result Value Ref Range   Alcohol, Ethyl (B) <10 <10 mg/dL    Comment: (NOTE)  Lowest detectable limit for serum alcohol is 10 mg/dL.  For medical purposes only. Performed at [facility], [place] [address] [place], [place] [id number]   Salicylate level     Status: Abnormal   Collection Time: 02/19/22  3:40 PM  Result Value Ref Range   Salicylate Lvl Q000111Q (L) 7.0 - 30.0 mg/dL    Comment: Performed at [facility], [place] [address], [place], [place] [address]  Acetaminophen level     Status: Abnormal   Collection Time: 02/19/22  3:40 PM  Result Value Ref Range   Acetaminophen (Tylenol), Serum <10 (L) 10 - 30 ug/mL    Comment: (NOTE) Therapeutic concentrations vary significantly. A range of 10-30 ug/mL  may be an effective concentration for many patients. However, some  are best treated at concentrations outside of this range. Acetaminophen concentrations >150 ug/mL at 4 hours after ingestion  and >50 ug/mL at 12 hours after ingestion are often associated with  toxic reactions.  Performed at [facility], [place] [address], [place], [place] [address]   cbc     Status: Abnormal   Collection Time: 02/19/22  3:40 PM  Result Value Ref Range   WBC 8.8 4.0 - 10.5 K/uL   RBC 5.27 (H) 3.87 - 5.11 MIL/uL   Hemoglobin 13.8 12.0 - 15.0 g/dL   HCT 41.4 36.0 - 46.0 %   MCV 78.6 (L) 80.0 - 100.0 fL   MCH 26.2 26.0 - 34.0 pg   MCHC 33.3 30.0 - 36.0 g/dL   RDW 14.2 11.5 - 15.5 %   Platelets 329 150 - 400 K/uL   nRBC 0.0 0.0 - 0.2 %    Comment: Performed at [facility], [place] [address] [place],  [address]    No current facility-administered medications for this encounter.   Current Outpatient Medications  Medication Sig Dispense Refill   ARIPiprazole (ABILIFY) 5 MG tablet Take 1 tablet (5 mg total) by mouth daily. (Patient not taking: Reported on 02/20/2022) 30 tablet 2   calcium carbonate (OSCAL) 1500 (600 Ca) MG TABS tablet Take 1 tablet by mouth in the morning and at bedtime. (Patient not taking: Reported on 12/25/2021)     etonogestrel (NEXPLANON) 68 MG IMPL implant 68 mg by Subdermal route once.      hydrochlorothiazide (HYDRODIURIL) 25 MG tablet TAKE 1 TABLET(25 MG) BY MOUTH DAILY (Patient not taking: Reported on 02/20/2022) 90 tablet 0   losartan (COZAAR) 25 MG tablet TAKE 2 TABLETS BY MOUTH DAILY (Patient not taking: Reported on 02/20/2022) 90 tablet 1   methotrexate 50 MG/2ML injection Inject 25 mg into the skin every Wednesday. (Patient not taking: Reported on 02/20/2022)     mupirocin ointment (BACTROBAN) 2 % Apply 1 application. topically 2 (two) times daily. Place 1 inch strip in saline mixture and shake well to mix, rinse bilateral nasal cavities twice daily 30 g 2   nicotine (NICODERM CQ - DOSED IN MG/24 HOURS) 21 mg/24hr patch Place 1 patch (21 mg total) onto the skin daily. (Patient not taking: Reported on 02/20/2022) 28 patch 0   predniSONE (DELTASONE) 10 MG tablet Take 40 mg by mouth daily with breakfast. (Patient not taking: Reported on 02/20/2022)      sertraline (ZOLOFT) 50 MG tablet Take 1 tablet (50 mg total) by mouth daily. (Patient not taking: Reported on 02/20/2022) 30 tablet 3   traZODone (DESYREL) 50 MG tablet Take 1 tablet (50 mg total) by mouth at bedtime. (Patient  not taking: Reported on 02/20/2022) 30 tablet 1    Musculoskeletal: Strength & Muscle Tone: within normal limits Gait & Station: normal Patient leans: N/A   Psychiatric Specialty Exam: Presentation  General Appearance:  Appropriate for Environment; Fairly Groomed  Eye Contact: Good  Speech: Clear and Coherent; Normal Rate  Speech Volume: Normal  Handedness: Right   Mood and Affect  Mood: Depressed  Affect: Congruent   Thought Process  Thought Processes: Coherent; Goal Directed  Descriptions of Associations:Intact  Orientation:Full (Time, Place and Person)  Thought Content:Logical  History of Schizophrenia/Schizoaffective disorder:No  Duration of Psychotic Symptoms:No data recorded Hallucinations:No data recorded Ideas of Reference:None  Suicidal Thoughts:No data recorded Homicidal Thoughts:No data recorded  Sensorium  Memory: Immediate Good; Recent Good; Remote Fair  Judgment: Good  Insight: Good   Executive Functions  Concentration: Good  Attention Span: Good  Recall: Good  Fund of Knowledge: Good  Language: Good   Psychomotor Activity  Psychomotor Activity:No data recorded  Assets  Assets: Communication Skills; Desire for Improvement; Housing; Social Support; Physical Health    Sleep  Sleep:No data recorded  Physical Exam: Physical Exam HENT:     Head: Normocephalic.  Eyes:     Pupils: Pupils are equal, round, and reactive to light.  Cardiovascular:     Rate and Rhythm: Normal rate.  Musculoskeletal:     Cervical back: Normal range of motion.  Neurological:     Mental Status: She is alert.  Psychiatric:        Attention and Perception: She is inattentive.        Mood and Affect: Mood  is depressed. Affect is tearful.        Speech: Speech is delayed.        Behavior: Behavior is cooperative.        Thought Content: Thought content is paranoid and delusional.        Judgment: Judgment is inappropriate.    Review of Systems  Constitutional: Negative.   HENT: Negative.    Psychiatric/Behavioral:  Positive for hallucinations.    Blood pressure (!) 159/89, pulse 91, temperature 98.1 F (36.7 C), temperature source Oral, resp. rate 16, SpO2 98 %. There is no height or weight on file to calculate BMI.  Medical Decision Making: Recommend psychiatric inpatient treatment.  Will have pharmacy go over her medications and restart them.   Disposition: Recommend psychiatric Inpatient admission when medically cleared.  Michaele Offer, PMHNP 02/20/2022 4:28 PM

## 2022-02-20 NOTE — ED Provider Notes (Signed)
Emergency Medicine Observation Re-evaluation Note  Sandra Lee is a 37 y.o. female, seen on rounds today.  Pt initially presented to the ED for complaints of Anxiety Currently, the patient is Sleeping.  Physical Exam  BP (!) 159/89 (BP Location: Left Arm)   Pulse 91   Temp 98.1 F (36.7 C) (Oral)   Resp 16   SpO2 98%  Physical Exam General: sleeping Cardiac: well perfused Lungs: lungs clear Psych: calm  ED Course / MDM  EKG:   I have reviewed the labs performed to date as well as medications administered while in observation.  Recent changes in the last 24 hours include awaiting psychiatric evaluation.  Plan  Current plan is for [psychiatric evaluation.    Ezequiel Essex, MD 02/20/22 [phone]

## 2022-02-20 NOTE — ED Notes (Signed)
Patient has been alert this shift but has been sleeping.  Patient flat, dull, guarded.  Patient tearful.  Patient did get up to eat lunch and dinner.

## 2022-02-20 NOTE — ED Notes (Signed)
Pt remains asleep with no signs of sleep disturbance or distress respiration are easy. Call from TTS related to eval Sandra Lee is currently unable to participate post geodon.

## 2022-02-20 NOTE — Progress Notes (Addendum)
CSW requested [facility] AC Nash Mantis, RN to review pt for [facility]. Pt meets inpatient criteria per provider Michaele Offer, PMHNP. CSW and Disposition team will assist and follow with placement.  Benjaman Kindler, MSW, [facility] 02/20/2022 11:44 PM

## 2022-02-20 NOTE — BH Assessment (Addendum)
Clinician contacted RN Jeanie Sewer.  He confirmed that patient is still sleeping after having been sedated earlier in the evening.

## 2022-02-20 NOTE — ED Notes (Signed)
Remains asleep , no signs of distress or sleep disturbance, unable to awaken at this time d/t PRN medications given earlier.

## 2022-02-21 NOTE — ED Provider Notes (Signed)
Emergency Medicine Observation Re-evaluation Note  Sandra Lee is a 37 y.o. female, seen on rounds today.  Pt initially presented to the ED for complaints of Anxiety Currently, the patient is Sleeping.  Physical Exam  BP (!) 107/90 (BP Location: Right Arm)   Pulse 93   Temp 98.6 F (37 C) (Oral)   Resp 16   SpO2 100%  Physical Exam General: No distress Cardiac: Well-perfused Lungs: No increased work of breathing Psych: Calm  ED Course / MDM  EKG:EKG Interpretation  Date/Time:  Wednesday February 19 2022 20:50:57 EST Ventricular Rate:  74 PR Interval:  134 QRS Duration: 86 QT Interval:  434 QTC Calculation: 481 R Axis:   88 Text Interpretation: Normal sinus rhythm Prolonged QT Abnormal ECG No previous ECGs available Confirmed by Lavenia Atlas [phone]) on 02/20/2022 11:39:20 AM  I have reviewed the labs performed to date as well as medications administered while in observation.  Recent changes in the last 24 hours include none.  Plan  Current plan is for psychiatric placement.    Ezequiel Essex, MD 02/21/22 [phone]

## 2022-02-21 NOTE — Progress Notes (Signed)
[facility] Psych ED Progress Note  02/21/2022 4:54 PM Sandra Lee  MRN:  [id number]   Principal Problem: Severe episode of recurrent major depressive disorder, with psychotic features ([place]) Diagnosis:  Principal Problem:   Severe episode of recurrent major depressive disorder, with psychotic features ([place]) Active Problems:   Cocaine use disorder, moderate, dependence ([place])   ED Assessment Time Calculation: Start Time: 1400 Stop Time: 1415 Total Time in Minutes (Assessment Completion): 15   Subjective:  On evaluation today, the patient is laying in her bed, tearful, when provider walked in, she just hung up the phone, she did say who she was talking with. She is calm, tearful and cooperative during this assessment. She is appropriately dressed for environment.  Her eye contact is fair.  Speech is clear and coherent, normal pace and normal volume.  She reports her mood is sad.  Affect is congruent with mood.  Thought process is coherent.  Patient has when she leaving, provider informed her that she was being admitted to an inpatient facility due to her safety.  Patient asked why, provider discussed with her the previous conversation from yesterday about her feeling that people are after her, and informed her that we want to keep her safe.  Provider asked patient if she was having any more hallucinations, patient did not answer.  No indication that she is responding to internal stimuli during this assessment.  No delusions elicited during this assessment.  She denies suicidal ideations.  She denies homicidal ideations.     [place] Scale:  [place] ED from 02/19/2022 in [facility] Emergency Department at [facility] ED from 05/27/2021 in [facility] Emergency Department at [facility] ED from 08/15/2020 in [place] No Risk High Risk No Risk       Past Medical History:  Past Medical History:  Diagnosis Date   Abnormal Pap  smear    Depression    hx of depression - not taking medication   Heart murmur 37 yrs old   not sure if it is still present.   PIH (pregnancy induced hypertension), antepartum 11/19/2010   No past surgical history on file. Family History:  Family History  Problem Relation Age of Onset   Depression Mother    Early death Father        HIV   Arthritis Sister    Diabetes Maternal Aunt    Early death Maternal Aunt    Alcohol abuse Maternal Uncle    Diabetes Maternal Uncle    Diabetes Paternal Aunt    Diabetes Paternal Uncle    Arthritis Maternal Grandmother    Diabetes Maternal Grandmother    Heart disease Maternal Grandmother    Stroke Maternal Grandmother    Diabetes Maternal Grandfather    Social History:  Social History   Substance and Sexual Activity  Alcohol Use No     Social History   Substance and Sexual Activity  Drug Use Not Currently   Types: Cocaine    Social History   Socioeconomic History   Marital status: Single    Spouse name: Not on file   Number of children: Not on file   Years of education: Not on file   Highest education level: Not on file  Occupational History   Not on file  Tobacco Use   Smoking status: Every Day    Packs/day: 0.50    Types: Cigarettes   Smokeless tobacco: Never  Vaping Use  Vaping Use: Never used  Substance and Sexual Activity   Alcohol use: No   Drug use: Not Currently    Types: Cocaine   Sexual activity: Yes    Birth control/protection: Implant  Other Topics Concern   Not on file  Social History Narrative   Not on file   Social Determinants of Health   Financial Resource Strain: Not on file  Food Insecurity: Not on file  Transportation Needs: Not on file  Physical Activity: Not on file  Stress: Not on file  Social Connections: Not on file    Sleep: Fair  Appetite:  Fair  Current Medications: Current Facility-Administered Medications  Medication Dose Route Frequency Provider Last Rate Last Admin    ARIPiprazole (ABILIFY) tablet 5 mg  5 mg Oral Daily Motley-Mangrum, Natalina Wieting A, PMHNP   5 mg at 02/21/22 1033   calcium carbonate (OS-CAL - dosed in mg of elemental calcium) tablet 1,250 mg  1 tablet Oral BID WC Motley-Mangrum, Juanda Luba A, PMHNP   1,250 mg at 02/21/22 0835   sertraline (ZOLOFT) tablet 50 mg  50 mg Oral Daily Motley-Mangrum, Zamier Eggebrecht A, PMHNP   50 mg at 02/21/22 1033   traZODone (DESYREL) tablet 50 mg  50 mg Oral QHS Motley-Mangrum, Chisom Muntean A, PMHNP   50 mg at 02/20/22 2149   Current Outpatient Medications  Medication Sig Dispense Refill   ARIPiprazole (ABILIFY) 5 MG tablet Take 1 tablet (5 mg total) by mouth daily. (Patient not taking: Reported on 02/20/2022) 30 tablet 2   calcium carbonate (OSCAL) 1500 (600 Ca) MG TABS tablet Take 1 tablet by mouth in the morning and at bedtime. (Patient not taking: Reported on 12/25/2021)     etonogestrel (NEXPLANON) 68 MG IMPL implant 68 mg by Subdermal route once.      hydrochlorothiazide (HYDRODIURIL) 25 MG tablet TAKE 1 TABLET(25 MG) BY MOUTH DAILY (Patient not taking: Reported on 02/20/2022) 90 tablet 0   losartan (COZAAR) 25 MG tablet TAKE 2 TABLETS BY MOUTH DAILY (Patient not taking: Reported on 02/20/2022) 90 tablet 1   methotrexate 50 MG/2ML injection Inject 25 mg into the skin every Wednesday. (Patient not taking: Reported on 02/20/2022)     mupirocin ointment (BACTROBAN) 2 % Apply 1 application. topically 2 (two) times daily. Place 1 inch strip in saline mixture and shake well to mix, rinse bilateral nasal cavities twice daily 30 g 2   nicotine (NICODERM CQ - DOSED IN MG/24 HOURS) 21 mg/24hr patch Place 1 patch (21 mg total) onto the skin daily. (Patient not taking: Reported on 02/20/2022) 28 patch 0   predniSONE (DELTASONE) 10 MG tablet Take 40 mg by mouth daily with breakfast. (Patient not taking: Reported on 02/20/2022)     sertraline (ZOLOFT) 50 MG tablet Take 1 tablet (50 mg total) by mouth daily. (Patient not taking: Reported on 02/20/2022) 30 tablet 3    traZODone (DESYREL) 50 MG tablet Take 1 tablet (50 mg total) by mouth at bedtime. (Patient not taking: Reported on 02/20/2022) 30 tablet 1    Lab Results: No results found for this or any previous visit (from the past 48 hour(s)).  Blood Alcohol level:  Lab Results  Component Value Date   ETH <10 02/19/2022   ETH <10 05/27/2021    Physical Findings:  CIWA:    COWS:     Musculoskeletal: Strength & Muscle Tone: within normal limits Gait & Station: normal Patient leans: N/A  Psychiatric Specialty Exam:  Presentation  General Appearance:  Appropriate for Environment  Eye Contact: Minimal  Speech: Clear and Coherent  Speech Volume: Normal  Handedness: Right   Mood and Affect  Mood: Depressed  Affect: Tearful; Appropriate   Thought Process  Thought Processes: Coherent  Descriptions of Associations:Intact  Orientation:Full (Time, Place and Person)  Thought Content:WDL  History of Schizophrenia/Schizoaffective disorder:No  Duration of Psychotic Symptoms:N/A  Hallucinations:Hallucinations: None Description of Auditory Hallucinations: voices they are going to harm her and son Description of Visual Hallucinations: men  Ideas of Reference:None  Suicidal Thoughts:Suicidal Thoughts: No  Homicidal Thoughts:Homicidal Thoughts: No   Sensorium  Memory: Immediate Fair; Remote Fair  Judgment: Fair  Insight: Fair   Community education officer  Concentration: Good  Attention Span: Good  Recall: [place] of Knowledge: Fair  Language: Good   Psychomotor Activity  Psychomotor Activity: Psychomotor Activity: Normal   Assets  Assets: Communication Skills; Social Support   Sleep  Sleep: Sleep: Fair    Physical Exam: Physical Exam Eyes:     Pupils: Pupils are equal, round, and reactive to light.  Abdominal:     General: Abdomen is flat.  Musculoskeletal:        General: Normal range of motion.  Neurological:     Mental  Status: She is alert.  Psychiatric:        Attention and Perception: Attention normal.        Mood and Affect: Mood is depressed. Affect is tearful.        Speech: Speech normal.        Behavior: Behavior is withdrawn.        Thought Content: Thought content is paranoid and delusional.        Judgment: Judgment is inappropriate.    Review of Systems  Constitutional: Negative.   HENT: Negative.    Musculoskeletal: Negative.   Psychiatric/Behavioral:  Positive for depression and hallucinations.    Blood pressure (!) 148/97, pulse 84, temperature 98.4 F (36.9 C), temperature source Oral, resp. rate 16, SpO2 95 %. There is no height or weight on file to calculate BMI.   Medical Decision Making: Patient continues to require inpatient Psychiatric hospitalization.  Patient compliant with medications while in ED.   [place], PMHNP 02/21/2022, 4:54 PM

## 2022-02-21 NOTE — ED Notes (Signed)
Patient currently sleeping in room on bed with no signs of acute distress noted. Pt has equal chest rise and fall. Sitter within line of sight of patient. Pt refused morning food, but took all appropriate medications that were prescribed for her. Pt has no complaints at this time.

## 2022-02-21 NOTE — ED Notes (Signed)
Patient sleeping comfortably in bed with no signs of acute distress noted. Equal chest rise and fall while sleeping. Pt has no complaints, denied all of her meals so far today, but compliant with medications.

## 2022-02-21 NOTE — ED Notes (Signed)
Patient sleeping in bed with no signs of acute distress noted. Equal chest rise and fall noted. Patient arouses easily, but requests to be left to sleep. Denies any needs at this time.

## 2022-02-21 NOTE — ED Notes (Signed)
Remains asleep with no signs of distress or disturbed sleep. Respirations are easy as evidenced by the rise and fall of pt chest skin color is WNL for ethnicity.

## 2022-02-22 LAB — SARS CORONAVIRUS 2 BY RT PCR: SARS Coronavirus 2 by RT PCR: NEGATIVE

## 2022-02-22 MED ORDER — NICOTINE 14 MG/24HR TD PT24
14.0000 mg | MEDICATED_PATCH | Freq: Every day | TRANSDERMAL | Status: DC
  Administered 2022-02-22: 14 mg via TRANSDERMAL
  Filled 2022-02-22: qty 1

## 2022-02-22 MED ORDER — LOSARTAN POTASSIUM 50 MG PO TABS
50.0000 mg | ORAL_TABLET | Freq: Every day | ORAL | Status: DC
  Administered 2022-02-22: 50 mg via ORAL
  Filled 2022-02-22: qty 1

## 2022-02-22 MED ORDER — HYDROCHLOROTHIAZIDE 12.5 MG PO TABS
25.0000 mg | ORAL_TABLET | Freq: Every day | ORAL | Status: DC
  Administered 2022-02-22: 25 mg via ORAL
  Filled 2022-02-22: qty 2

## 2022-02-22 MED ORDER — CLONIDINE HCL 0.1 MG PO TABS
0.1000 mg | ORAL_TABLET | Freq: Once | ORAL | Status: AC
  Administered 2022-02-22: 0.1 mg via ORAL
  Filled 2022-02-22: qty 1

## 2022-02-22 NOTE — ED Notes (Signed)
Called report to [facility]. They stated they will not accept the pt w/a diastolic above 90. MD notified and clonidine given.

## 2022-02-22 NOTE — ED Notes (Signed)
Pt has been accepted to [facility]. Accepting provider is Dr Estell Harpin. They can arrive anytime today

## 2022-02-22 NOTE — Progress Notes (Signed)
Per [place] [phone] Pt is under review and CSW will assist and follow.   Benjaman Kindler, MSW, [facility] 02/22/2022 10:48 AM

## 2022-02-22 NOTE — ED Provider Notes (Signed)
Emergency Medicine Observation Re-evaluation Note  Sandra Lee is a 37 y.o. female, seen on rounds today at 0700.  Pt initially presented to the ED for complaints of Anxiety Currently, the patient is resting comfortably.  Physical Exam  BP (!) 184/110 (BP Location: Left Arm)   Pulse 86   Temp 98.4 F (36.9 C) (Oral)   Resp 18   SpO2 96%  Physical Exam General: NAD   ED Course / MDM  EKG:EKG Interpretation  Date/Time:  Wednesday February 19 2022 20:50:57 EST Ventricular Rate:  74 PR Interval:  134 QRS Duration: 86 QT Interval:  434 QTC Calculation: 481 R Axis:   88 Text Interpretation: Normal sinus rhythm Prolonged QT Abnormal ECG No previous ECGs available Confirmed by Lavenia Atlas [phone]) on 02/20/2022 11:39:20 AM  I have reviewed the labs performed to date as well as medications administered while in observation.  Recent changes in the last 24 hours include no acute events reported.  Plan  Current plan is for placement - Accepted to [facility] by Dr. Linton Rump, [place] B[place], MD 02/22/22 [phone]

## 2022-02-22 NOTE — Progress Notes (Signed)
Pt was accepted to [facility] 02/22/22; Main Campus  Pt meets inpatient criteria per Michaele Offer, [place]   Attending Physician will be Dr. Mardelle Matte   Report can be called to:[phone]-Pager number, please leave a returned phone number to receive a phone call back.   Nursing was notified due to pt being accepted afterhours outside of CSW's shift.---Care Team notified: Derrill Kay, RN and Michaele Offer, [place], [place] 02/22/2022 @ 10:52 AM

## 2022-02-22 NOTE — Progress Notes (Signed)
Inpatient Behavioral Health Placement  Pt meets inpatient criteria per Michaele Offer, PMHNP. There are no available beds at [facility] per [place], RN at this time.  Referral was sent to the following facilities;    Destination Service Provider Address Phone Fax  [facility]  [address] [place] [address] [place]  [facility]  [address] [place] [address] [place]  [facility]  [address] [place] [address] [phone]  [facility]  [place], [place] [address] [phone]  [facility]  [address] [place], [place] [address] [phone] [place] [address] [place] [address] [place]  [facility]  [address] [place] [address] [phone]  [facility]  [address] [place] [address] [phone]  [place] [address] [place] [address] [phone]  [facility]  [address] [place] [address] [phone] [place]  [address] [place] [address] [phone] [facility]  [address] [place] [address] [phone] [facility]  [address] [place] [address] [place]  [address] [place] [address] [phone] [facility]  [address] [place] [address] [phone] [facility]  [address], [place] [address] [phone]  [facility]   [address], [place] [address] [phone]  [facility]  [address], [place] [address] [phone]  [facility]  [place], [place] [address] [place]  [facility]  [address], [place] [address] [phone]  [facility]  [address] [place], [place] [address] [phone] [place]  [address] [place]  [address] [phone]   Situation ongoing,  CSW will follow up.   Benjaman Kindler, MSW, [facility] 02/22/2022  @ 12:59 AM

## 2022-02-22 NOTE — ED Notes (Signed)
Attempted to call [facility]. No answer. Message left.

## 2022-03-03 ENCOUNTER — Ambulatory Visit: Payer: Medicaid Other | Admitting: Student

## 2022-03-03 NOTE — Progress Notes (Deleted)
  SUBJECTIVE:   CHIEF COMPLAINT / HPI:   Patient seen in clinic 02/04/22 after having visited [facility] Patient was started on Zoloft, trazodone and abilify  Patient seen 02/19/22 at [facility] and IVC for psychosis (responding to internal stimuli, pressured speech, tearful, non-linear thoughts) -Unsure what meds patient d/c on, possibly abilify alone.    PERTINENT  PMH / PSH: ***  Past Medical History:  Diagnosis Date   Abnormal Pap smear    Depression    hx of depression - not taking medication   Heart murmur 37 yrs old   not sure if it is still present.   PIH (pregnancy induced hypertension), antepartum 11/19/2010    OBJECTIVE:  There were no vitals taken for this visit. Physical Exam   ASSESSMENT/PLAN:  There are no diagnoses linked to this encounter. No follow-ups on file. Holley Bouche, MD 03/03/2022, 6:22 AM PGY-***, [facility] {    This will disappear when note is signed, click to select method of visit    :1}

## 2022-03-03 NOTE — Patient Instructions (Incomplete)
It was great to see you! Thank you for allowing me to participate in your care!  I recommend that you always bring your medications to each appointment as this makes it easy to ensure we are on the correct medications and helps us not miss when refills are needed.  Our plans for today:  - *** -   We are checking some labs today, I will call you if they are abnormal will send you a MyChart message or a letter if they are normal.  If you do not hear about your labs in the next 2 weeks please let us know.***  Take care and seek immediate care sooner if you develop any concerns.   Dr. Skarlett Sedlacek, MD [facility]  

## 2022-03-18 ENCOUNTER — Ambulatory Visit (HOSPITAL_COMMUNITY): Payer: Medicaid Other | Admitting: Physician Assistant

## 2022-03-20 ENCOUNTER — Encounter (HOSPITAL_COMMUNITY): Payer: Self-pay | Admitting: Physician Assistant

## 2022-03-20 ENCOUNTER — Ambulatory Visit (INDEPENDENT_AMBULATORY_CARE_PROVIDER_SITE_OTHER): Payer: Medicaid Other | Admitting: Physician Assistant

## 2022-03-20 DIAGNOSIS — F1994 Other psychoactive substance use, unspecified with psychoactive substance-induced mood disorder: Secondary | ICD-10-CM

## 2022-03-20 DIAGNOSIS — F333 Major depressive disorder, recurrent, severe with psychotic symptoms: Secondary | ICD-10-CM

## 2022-03-20 MED ORDER — SERTRALINE HCL 50 MG PO TABS: 50.0000 mg | ORAL_TABLET | Freq: Every day | ORAL | 2 refills | Status: DC

## 2022-03-20 MED ORDER — ARIPIPRAZOLE 5 MG PO TABS: 5.0000 mg | ORAL_TABLET | Freq: Every day | ORAL | 2 refills | Status: DC

## 2022-03-20 MED ORDER — TRAZODONE HCL 50 MG PO TABS: 100.0000 mg | ORAL_TABLET | Freq: Every evening | ORAL | 2 refills | Status: DC | PRN
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
## 2022-04-28 ENCOUNTER — Ambulatory Visit (HOSPITAL_COMMUNITY): Payer: Medicaid Other | Admitting: Mental Health

## 2022-04-28 ENCOUNTER — Telehealth (HOSPITAL_COMMUNITY): Payer: Self-pay | Admitting: Mental Health

## 2022-04-28 ENCOUNTER — Encounter (HOSPITAL_COMMUNITY): Payer: Self-pay

## 2022-04-28 NOTE — Telephone Encounter (Signed)
Therapist sent link for tele-therapy session. No response after x 10 minutes. Contacted number on file with automated message "the person you called is not accepting calls at this time."  No show

## 2022-05-01 ENCOUNTER — Encounter (HOSPITAL_COMMUNITY): Payer: Medicaid Other | Admitting: Physician Assistant

## 2022-06-24 ENCOUNTER — Encounter (HOSPITAL_COMMUNITY): Payer: Medicaid Other | Admitting: Physician Assistant

## 2022-06-27 ENCOUNTER — Ambulatory Visit (INDEPENDENT_AMBULATORY_CARE_PROVIDER_SITE_OTHER): Payer: Medicaid Other | Admitting: Physician Assistant

## 2022-06-27 DIAGNOSIS — F1994 Other psychoactive substance use, unspecified with psychoactive substance-induced mood disorder: Secondary | ICD-10-CM

## 2022-06-27 DIAGNOSIS — F333 Major depressive disorder, recurrent, severe with psychotic symptoms: Secondary | ICD-10-CM | POA: Diagnosis not present

## 2022-06-27 MED ORDER — ARIPIPRAZOLE 5 MG PO TABS
5.0000 mg | ORAL_TABLET | Freq: Every day | ORAL | 3 refills | Status: DC
Start: 2022-06-27 — End: 2022-11-19

## 2022-06-27 MED ORDER — SERTRALINE HCL 50 MG PO TABS
50.0000 mg | ORAL_TABLET | Freq: Every day | ORAL | 3 refills | Status: DC
Start: 2022-06-27 — End: 2022-11-19

## 2022-06-27 MED ORDER — TRAZODONE HCL 100 MG PO TABS
100.0000 mg | ORAL_TABLET | Freq: Every evening | ORAL | 3 refills | Status: DC | PRN
Start: 2022-06-27 — End: 2022-11-19

## 2022-06-30 ENCOUNTER — Encounter (HOSPITAL_COMMUNITY): Payer: Self-pay | Admitting: Physician Assistant

## 2022-06-30 NOTE — Progress Notes (Signed)
BH MD/PA/NP OP Progress Note  06/30/2022 10:31 AM Sandra Lee  MRN:  [id number]  Chief Complaint:  Chief Complaint  Patient presents with   Follow-up   Medication Refill   HPI:   Sandra Lee is a 37 year old, African-American female the past psychiatric history significant for major depressive disorder with psychotic features who presents to [facility] for follow up and medication management. Patient is currently being managed on the following psychiatric medications:  Abilify 5 mg daily Zoloft 50 mg daily Trazodone 100 mg at bedtime  Patient presents today encounter stating that she has been out of her medications for a couple of weeks.  Patient states that when she had her medications, she states that they were helpful.  Patient continues to endorse ongoing depression she rates an 8 out of 10 with 10 being most severe.  Patient endorses depressive symptoms every day with symptoms lasting the whole day.  Patient also endorses anxiety and rates her anxiety a 4 or 5 out of 10.  Patient denies stressors at this time.  A PHQ-9 screen was performed with the patient scoring a 24.  A GAD-7 screen was also performed the patient scoring a 21.  Patient is alert and oriented x 4, calm, cooperative, and fully engaged in conversation during the encounter.  Patient endorses all right mood.  Patient denies suicidal or homicidal ideations.  She further denies auditory or visual hallucinations and does not appear to be responding to internal fresh external stimuli.  Patient endorses poor sleep and receives on average 2 to 3 hours of sleep per night.  Patient states that she experienced more sleep through the use of her trazodone.  Patient endorses decreased appetite and eats on average 1 meal per day.  Patient denies alcohol consumption, tobacco use, and illicit drug use.  Visit Diagnosis:    ICD-10-CM   1. Severe episode of recurrent major depressive disorder,  with psychotic features (HCC)  F33.3 sertraline (ZOLOFT) 50 MG tablet    ARIPiprazole (ABILIFY) 5 MG tablet    traZODone (DESYREL) 100 MG tablet    2. Substance induced mood disorder (HCC)  F19.94 traZODone (DESYREL) 100 MG tablet      Past Psychiatric History:  Per chart review, patient has a past psychiatric history significant for major depressive disorder (severe, recurrent, with psychotic features) and substance-induced mood disorder   Past Medical History:  Past Medical History:  Diagnosis Date   Abnormal Pap smear    Depression    hx of depression - not taking medication   Heart murmur 37 yrs old   not sure if it is still present.   PIH (pregnancy induced hypertension), antepartum 11/19/2010   History reviewed. No pertinent surgical history.  Family Psychiatric History:  Not noted  Family History:  Family History  Problem Relation Age of Onset   Depression Mother    Early death Father        HIV   Arthritis Sister    Diabetes Maternal Aunt    Early death Maternal Aunt    Alcohol abuse Maternal Uncle    Diabetes Maternal Uncle    Diabetes Paternal Aunt    Diabetes Paternal Uncle    Arthritis Maternal Grandmother    Diabetes Maternal Grandmother    Heart disease Maternal Grandmother    Stroke Maternal Grandmother    Diabetes Maternal Grandfather     Social History:  Social History   Socioeconomic History   Marital status: Single  Spouse name: Not on file   Number of children: Not on file   Years of education: Not on file   Highest education level: Not on file  Occupational History   Not on file  Tobacco Use   Smoking status: Every Day    Packs/day: .5    Types: Cigarettes   Smokeless tobacco: Never  Vaping Use   Vaping Use: Never used  Substance and Sexual Activity   Alcohol use: No   Drug use: Not Currently    Types: Cocaine   Sexual activity: Yes    Birth control/protection: Implant  Other Topics Concern   Not on file  Social History  Narrative   Not on file   Social Determinants of Health   Financial Resource Strain: Not on file  Food Insecurity: Not on file  Transportation Needs: Not on file  Physical Activity: Not on file  Stress: Not on file  Social Connections: Not on file    Allergies:  Allergies  Allergen Reactions   Penicillins Other (See Comments)    Childhood reaction patient unsure of allergic response  Did it involve swelling of the face/tongue/throat, SOB, or low BP? Unknown Did it involve sudden or severe rash/hives, skin peeling, or any reaction on the inside of your mouth or nose? Unknown Did you need to seek medical attention at a hospital or doctor's office? Unknown When did it last happen?      childhood If all above answers are "NO", may proceed with cephalosporin use.      Metabolic Disorder Labs: Lab Results  Component Value Date   HGBA1C 5.9 (H) 08/15/2020   MPG 122.63 08/15/2020   Lab Results  Component Value Date   PROLACTIN 3.5 (L) 08/15/2020   Lab Results  Component Value Date   CHOL 175 08/15/2020   TRIG 93 08/15/2020   HDL 50 08/15/2020   CHOLHDL 3.5 08/15/2020   VLDL 19 08/15/2020   LDLCALC 106 (H) 08/15/2020   Lab Results  Component Value Date   TSH 2.130 06/13/2021   TSH 1.093 08/15/2020    Therapeutic Level Labs: No results found for: "LITHIUM" No results found for: "VALPROATE" No results found for: "CBMZ"  Current Medications: Current Outpatient Medications  Medication Sig Dispense Refill   ARIPiprazole (ABILIFY) 5 MG tablet Take 1 tablet (5 mg total) by mouth daily. 30 tablet 3   calcium carbonate (OSCAL) 1500 (600 Ca) MG TABS tablet Take 1 tablet by mouth in the morning and at bedtime. (Patient not taking: Reported on 12/25/2021)     etonogestrel (NEXPLANON) 68 MG IMPL implant 68 mg by Subdermal route once.      hydrochlorothiazide (HYDRODIURIL) 25 MG tablet TAKE 1 TABLET(25 MG) BY MOUTH DAILY (Patient not taking: Reported on 02/20/2022) 90 tablet 0    losartan (COZAAR) 25 MG tablet TAKE 2 TABLETS BY MOUTH DAILY (Patient not taking: Reported on 02/20/2022) 90 tablet 1   methotrexate 50 MG/2ML injection Inject 25 mg into the skin every Wednesday. (Patient not taking: Reported on 02/20/2022)     mupirocin ointment (BACTROBAN) 2 % Apply 1 application. topically 2 (two) times daily. Place 1 inch strip in saline mixture and shake well to mix, rinse bilateral nasal cavities twice daily 30 g 2   nicotine (NICODERM CQ - DOSED IN MG/24 HOURS) 21 mg/24hr patch Place 1 patch (21 mg total) onto the skin daily. (Patient not taking: Reported on 02/20/2022) 28 patch 0   predniSONE (DELTASONE) 10 MG tablet Take 40 mg by  mouth daily with breakfast. (Patient not taking: Reported on 02/20/2022)     sertraline (ZOLOFT) 50 MG tablet Take 1 tablet (50 mg total) by mouth daily. 30 tablet 3   traZODone (DESYREL) 100 MG tablet Take 1 tablet (100 mg total) by mouth at bedtime as needed for sleep. 30 tablet 3   No current facility-administered medications for this visit.     Musculoskeletal: Strength & Muscle Tone: within normal limits Gait & Station: normal Patient leans: N/A  Psychiatric Specialty Exam: Review of Systems  HENT:  Positive for congestion and sinus pain.   Psychiatric/Behavioral:  Positive for sleep disturbance. Negative for decreased concentration, dysphoric mood, hallucinations, self-injury and suicidal ideas. The patient is nervous/anxious. The patient is not hyperactive.     Blood pressure (!) 133/92, pulse 78, temperature 98.2 F (36.8 C), temperature source Oral, height 5\' 5"  (1.651 m), weight 151 lb (68.5 kg), SpO2 100 %.Body mass index is 25.13 kg/m.  General Appearance: Casual  Eye Contact:  Fair  Speech:  Clear and Coherent and Normal Rate  Volume:  Normal  Mood:  Anxious and Depressed  Affect:  Congruent  Thought Process:  Coherent and Descriptions of Associations: Intact  Orientation:  Full (Time, Place, and Person)  Thought Content:  WDL   Suicidal Thoughts:  No  Homicidal Thoughts:  No  Memory:  Immediate;   Good Recent;   Fair Remote;   Fair  Judgement:  Good  Insight:  Good  Psychomotor Activity:  Normal  Concentration:  Concentration: Good and Attention Span: Good  Recall:  Fair  Fund of Knowledge: Good  Language: Good  Akathisia:  No  Handed:  Right  AIMS (if indicated): not done  Assets:  Communication Skills Desire for Improvement Financial Resources/Insurance Vocational/Educational  ADL's:  Intact  Cognition: WNL  Sleep:  Poor   Screenings: GAD-7    Flowsheet Row Clinical Support from 06/27/2022 in [facility] Clinical Support from 03/20/2022 in [facility] Counselor from 01/24/2022 in [facility] Office Visit from 09/10/2020 in [facility]  Total GAD-7 Score 21 19 21 6       PHQ2-9    Flowsheet Row Clinical Support from 06/27/2022 in [facility] Clinical Support from 03/20/2022 in [facility] Office Visit from 02/04/2022 in [facility] Counselor from 01/24/2022 in [facility] Office Visit from 01/09/2022 in [facility]  PHQ-2 Total Score 6 6 6 6 5   PHQ-9 Total Score 24 21 21 23 18       Flowsheet Row Clinical Support from 06/27/2022 in [facility] Clinical Support from 03/20/2022 in [facility] ED from 02/19/2022 in [facility] Emergency Department at [facility]  C-SSRS RISK CATEGORY Low Risk Error: Q6 is Yes, you must answer 7 No Risk        Assessment and Plan:   Mylinda Latina is a 37 year old, African-American female the past psychiatric history significant for major depressive disorder with psychotic features who presents to [facility] outpatient [facility] for follow  up and medication management.  Patient presents today requesting refills on her medications.  Patient presents today encounter stating that she is currently out of her medication and has been out for a couple of weeks.  Patient states that when she was taking her medications, they were helpful in managing her depressive  symptoms and anxiety.  Patient is endorsing ongoing depression as well as anxiety.  Patient endorses stability on her current medication regimen and will be placed back on her medications following the conclusion of the encounter.  Patient medications to be e-prescribed to pharmacy of choice.  Collaboration of Care: Collaboration of Care: Medication Management AEB provider managing patient's psychiatric medications, Primary Care Provider AEB patient being seen by a family med provider, Psychiatrist AEB patient being followed by mental health provider, Other provider involved in patient's care AEB patient being seen by rheumatology, and Referral or follow-up with counselor/therapist AEB patient being seen by a licensed clinical social worker at this facility  Patient/Guardian was advised Release of Information must be obtained prior to any record release in order to collaborate their care with an outside provider. Patient/Guardian was advised if they have not already done so to contact the registration department to sign all necessary forms in order for [place] to release information regarding their care.   Consent: Patient/Guardian gives verbal consent for treatment and assignment of benefits for services provided during this visit. Patient/Guardian expressed understanding and agreed to proceed.   1. Severe episode of recurrent major depressive disorder, with psychotic features (HCC)  - sertraline (ZOLOFT) 50 MG tablet; Take 1 tablet (50 mg total) by mouth daily.  Dispense: 30 tablet; Refill: 3 - ARIPiprazole (ABILIFY) 5 MG tablet; Take 1 tablet (5 mg total) by mouth daily.  Dispense: 30  tablet; Refill: 3 - traZODone (DESYREL) 100 MG tablet; Take 1 tablet (100 mg total) by mouth at bedtime as needed for sleep.  Dispense: 30 tablet; Refill: 3  2. Substance induced mood disorder (HCC)  - traZODone (DESYREL) 100 MG tablet; Take 1 tablet (100 mg total) by mouth at bedtime as needed for sleep.  Dispense: 30 tablet; Refill: 3  Patient to follow-up in 2 months Provider spent a total of 14 minutes with the patient/reviewing patient's chart  Meta Hatchet, PA 06/30/2022, 10:31 AM

## 2022-07-18 ENCOUNTER — Encounter (HOSPITAL_COMMUNITY): Payer: Self-pay

## 2022-07-18 ENCOUNTER — Ambulatory Visit (HOSPITAL_COMMUNITY): Payer: MEDICAID | Admitting: Mental Health

## 2022-07-18 ENCOUNTER — Telehealth (HOSPITAL_COMMUNITY): Payer: Self-pay | Admitting: Mental Health

## 2022-07-18 NOTE — Telephone Encounter (Signed)
Therapist sent link x 2 for tele-therapy session. No response. Contacted via telephone and received message "The person you have reached can not accept calls at this time." NS

## 2022-08-29 ENCOUNTER — Encounter (HOSPITAL_COMMUNITY): Payer: MEDICAID | Admitting: Physician Assistant

## 2022-11-19 ENCOUNTER — Ambulatory Visit (INDEPENDENT_AMBULATORY_CARE_PROVIDER_SITE_OTHER): Payer: MEDICAID | Admitting: Physician Assistant

## 2022-11-19 ENCOUNTER — Encounter (HOSPITAL_COMMUNITY): Payer: Self-pay | Admitting: Physician Assistant

## 2022-11-19 ENCOUNTER — Other Ambulatory Visit: Payer: Self-pay

## 2022-11-19 VITALS — BP 140/96 | HR 79 | Ht 65.0 in | Wt 154.6 lb

## 2022-11-19 DIAGNOSIS — F5105 Insomnia due to other mental disorder: Secondary | ICD-10-CM | POA: Insufficient documentation

## 2022-11-19 DIAGNOSIS — F99 Mental disorder, not otherwise specified: Secondary | ICD-10-CM

## 2022-11-19 DIAGNOSIS — F333 Major depressive disorder, recurrent, severe with psychotic symptoms: Secondary | ICD-10-CM | POA: Diagnosis not present

## 2022-11-19 DIAGNOSIS — F411 Generalized anxiety disorder: Secondary | ICD-10-CM

## 2022-11-19 MED ORDER — MELATONIN 3 MG PO TABS
3.0000 mg | ORAL_TABLET | Freq: Every day | ORAL | 1 refills | Status: AC
  Filled 2022-11-19: qty 30, 30d supply, fill #0

## 2022-11-19 MED ORDER — ARIPIPRAZOLE 5 MG PO TABS
ORAL_TABLET | ORAL | 1 refills | Status: DC
  Filled 2022-11-19: qty 60, 33d supply, fill #0

## 2022-11-19 MED ORDER — ESCITALOPRAM OXALATE 10 MG PO TABS
10.0000 mg | ORAL_TABLET | Freq: Every day | ORAL | 1 refills | Status: DC
Start: 2022-11-19 — End: 2023-04-01
  Filled 2022-11-19: qty 30, 30d supply, fill #0

## 2022-11-19 NOTE — Progress Notes (Signed)
BH MD/PA/NP OP Progress Note  11/19/2022 5:51 PM Sandra Lee  MRN:  [id number]  Chief Complaint:  Chief Complaint  Patient presents with   Follow-up   Medication Management   HPI:   Sandra Lee is a 37 year old, African-American female the past psychiatric history significant for major depressive disorder with psychotic features who presents to [facility] for follow up and medication management. Patient is currently being managed on the following psychiatric medications:  Abilify 5 mg daily Zoloft 50 mg daily Trazodone 100 mg at bedtime  ***  Patient is alert and oriented x 4, calm, cooperative, and fully engaged in conversation during the encounter. ***  Visit Diagnosis:    ICD-10-CM   1. Anxiety state  F41.1 escitalopram (LEXAPRO) 10 MG tablet    2. Severe episode of recurrent major depressive disorder, with psychotic features (HCC)  F33.3 escitalopram (LEXAPRO) 10 MG tablet    ARIPiprazole (ABILIFY) 5 MG tablet    3. Insomnia due to other mental disorder  F51.05 melatonin 3 MG TABS tablet   F99        Past Psychiatric History:  Per chart review, patient has a past psychiatric history significant for major depressive disorder (severe, recurrent, with psychotic features) and substance-induced mood disorder   Past Medical History:  Past Medical History:  Diagnosis Date   Abnormal Pap smear    Depression    hx of depression - not taking medication   Heart murmur 37 yrs old   not sure if it is still present.   PIH (pregnancy induced hypertension), antepartum 11/19/2010   History reviewed. No pertinent surgical history.  Family Psychiatric History:  Not noted  Family History:  Family History  Problem Relation Age of Onset   Depression Mother    Early death Father        HIV   Arthritis Sister    Diabetes Maternal Aunt    Early death Maternal Aunt    Alcohol abuse Maternal Uncle    Diabetes Maternal Uncle     Diabetes Paternal Aunt    Diabetes Paternal Uncle    Arthritis Maternal Grandmother    Diabetes Maternal Grandmother    Heart disease Maternal Grandmother    Stroke Maternal Grandmother    Diabetes Maternal Grandfather     Social History:  Social History   Socioeconomic History   Marital status: Single    Spouse name: Not on file   Number of children: Not on file   Years of education: Not on file   Highest education level: Not on file  Occupational History   Not on file  Tobacco Use   Smoking status: Every Day    Current packs/day: 0.50    Types: Cigarettes   Smokeless tobacco: Never  Vaping Use   Vaping status: Never Used  Substance and Sexual Activity   Alcohol use: No   Drug use: Not Currently    Types: Cocaine   Sexual activity: Yes    Birth control/protection: Implant  Other Topics Concern   Not on file  Social History Narrative   Not on file   Social Determinants of Health   Financial Resource Strain: Not on file  Food Insecurity: Low Risk  (08/25/2022)   Received from [facility]   Hunger Vital Sign    Worried About Running Out of Food in the Last Year: Never true    Ran Out of Food in the Last Year: Never true  Transportation Needs:  Not on file  Physical Activity: Not on file  Stress: Not on file  Social Connections: Not on file    Allergies:  Allergies  Allergen Reactions   Penicillins Other (See Comments)    Childhood reaction patient unsure of allergic response  Did it involve swelling of the face/tongue/throat, SOB, or low BP? Unknown Did it involve sudden or severe rash/hives, skin peeling, or any reaction on the inside of your mouth or nose? Unknown Did you need to seek medical attention at a hospital or doctor's office? Unknown When did it last happen?      childhood If all above answers are "NO", may proceed with cephalosporin use.      Metabolic Disorder Labs: Lab Results  Component Value Date   HGBA1C 5.9 (H) 08/15/2020   MPG  122.63 08/15/2020   Lab Results  Component Value Date   PROLACTIN 3.5 (L) 08/15/2020   Lab Results  Component Value Date   CHOL 175 08/15/2020   TRIG 93 08/15/2020   HDL 50 08/15/2020   CHOLHDL 3.5 08/15/2020   VLDL 19 08/15/2020   LDLCALC 106 (H) 08/15/2020   Lab Results  Component Value Date   TSH 2.130 06/13/2021   TSH 1.093 08/15/2020    Therapeutic Level Labs: No results found for: "LITHIUM" No results found for: "VALPROATE" No results found for: "CBMZ"  Current Medications: Current Outpatient Medications  Medication Sig Dispense Refill   escitalopram (LEXAPRO) 10 MG tablet Take 1 tablet (10 mg total) by mouth daily. 30 tablet 1   melatonin 3 MG TABS tablet Take 1 tablet (3 mg total) by mouth at bedtime. 30 tablet 1   ARIPiprazole (ABILIFY) 5 MG tablet Take 1 tablet (5 mg total) by mouth daily for 6 days, THEN 2 tablets (10 mg total) daily. 60 tablet 1   calcium carbonate (OSCAL) 1500 (600 Ca) MG TABS tablet Take 1 tablet by mouth in the morning and at bedtime. (Patient not taking: Reported on 12/25/2021)     etonogestrel (NEXPLANON) 68 MG IMPL implant 68 mg by Subdermal route once.      hydrochlorothiazide (HYDRODIURIL) 25 MG tablet TAKE 1 TABLET(25 MG) BY MOUTH DAILY (Patient not taking: Reported on 02/20/2022) 90 tablet 0   losartan (COZAAR) 25 MG tablet TAKE 2 TABLETS BY MOUTH DAILY (Patient not taking: Reported on 02/20/2022) 90 tablet 1   methotrexate 50 MG/2ML injection Inject 25 mg into the skin every Wednesday. (Patient not taking: Reported on 02/20/2022)     mupirocin ointment (BACTROBAN) 2 % Apply 1 application. topically 2 (two) times daily. Place 1 inch strip in saline mixture and shake well to mix, rinse bilateral nasal cavities twice daily 30 g 2   nicotine (NICODERM CQ - DOSED IN MG/24 HOURS) 21 mg/24hr patch Place 1 patch (21 mg total) onto the skin daily. (Patient not taking: Reported on 02/20/2022) 28 patch 0   predniSONE (DELTASONE) 10 MG tablet Take 40 mg by  mouth daily with breakfast. (Patient not taking: Reported on 02/20/2022)     No current facility-administered medications for this visit.     Musculoskeletal: Strength & Muscle Tone: within normal limits Gait & Station: normal Patient leans: N/A  Psychiatric Specialty Exam: Review of Systems  HENT:  Positive for congestion and sinus pain.   Psychiatric/Behavioral:  Positive for sleep disturbance. Negative for decreased concentration, dysphoric mood, hallucinations, self-injury and suicidal ideas. The patient is nervous/anxious. The patient is not hyperactive.     Blood pressure (!) 140/96, pulse 79,  height 5\' 5"  (1.651 m), weight 154 lb 9.6 oz (70.1 kg), SpO2 100%.Body mass index is 25.73 kg/m.  General Appearance: Casual  Eye Contact:  Fair  Speech:  Clear and Coherent and Normal Rate  Volume:  Normal  Mood:  Anxious and Depressed  Affect:  Congruent  Thought Process:  Coherent and Descriptions of Associations: Intact  Orientation:  Full (Time, Place, and Person)  Thought Content: WDL   Suicidal Thoughts:  No  Homicidal Thoughts:  No  Memory:  Immediate;   Good Recent;   Fair Remote;   Fair  Judgement:  Good  Insight:  Good  Psychomotor Activity:  Normal  Concentration:  Concentration: Good and Attention Span: Good  Recall:  Fair  Fund of Knowledge: Good  Language: Good  Akathisia:  No  Handed:  Right  AIMS (if indicated): not done  Assets:  Communication Skills Desire for Improvement Financial Resources/Insurance Vocational/Educational  ADL's:  Intact  Cognition: WNL  Sleep:  Poor   Screenings: GAD-7    Flowsheet Row Clinical Support from 11/19/2022 in [facility] Clinical Support from 06/27/2022 in [facility] Clinical Support from 03/20/2022 in [facility] Counselor from 01/24/2022 in [facility] Office Visit from 09/10/2020 in [facility]  Total GAD-7 Score 21 21 19 21 6       PHQ2-9    Flowsheet Row Clinical Support from 11/19/2022 in [facility] Clinical Support from 06/27/2022 in [facility] Clinical Support from 03/20/2022 in [facility] Office Visit from 02/04/2022 in [facility] [place] Counselor from 01/24/2022 in [facility]  PHQ-2 Total Score 6 6 6 6 6   PHQ-9 Total Score 25 24 21 21 23       Flowsheet Row Clinical Support from 11/19/2022 in [facility] Clinical Support from 06/27/2022 in [facility] Clinical Support from 03/20/2022 in [facility]  C-SSRS RISK CATEGORY Moderate Risk Low Risk Error: Q6 is Yes, you must answer 7        Assessment and Plan:   Mylinda Latina is a 37 year old, African-American female the past psychiatric history significant for major depressive disorder with psychotic features who presents to [facility] outpatient Clinic for follow up and medication management. ***  Collaboration of Care: Collaboration of Care: Medication Management AEB provider managing patient's psychiatric medications, Primary Care Provider AEB patient being seen by a family med provider, Psychiatrist AEB patient being followed by mental health provider, Other provider involved in patient's care AEB patient being seen by rheumatology, and Referral or follow-up with counselor/therapist AEB patient being seen by a licensed clinical social worker at this facility  Patient/Guardian was advised Release of Information must be obtained prior to any record release in order to collaborate their care with an outside provider. Patient/Guardian was advised if they have not already done so to contact the registration department to sign all necessary forms  in order for [place] to release information regarding their care.   Consent: Patient/Guardian gives verbal consent for treatment and assignment of benefits for services provided during this visit. Patient/Guardian expressed understanding and agreed to proceed.   1. Severe episode of recurrent major depressive disorder, with psychotic features (HCC)  - escitalopram (LEXAPRO) 10 MG tablet; Take 1 tablet (10 mg  total) by mouth daily.  Dispense: 30 tablet; Refill: 1 - ARIPiprazole (ABILIFY) 5 MG tablet; Take 1 tablet (5 mg total) by mouth daily for 6 days, THEN 2 tablets (10 mg total) daily.  Dispense: 60 tablet; Refill: 1  2. Anxiety state  - escitalopram (LEXAPRO) 10 MG tablet; Take 1 tablet (10 mg total) by mouth daily.  Dispense: 30 tablet; Refill: 1  3. Insomnia due to other mental disorder  - melatonin 3 MG TABS tablet; Take 1 tablet (3 mg total) by mouth at bedtime.  Dispense: 30 tablet; Refill: 1  Patient to follow-up in 6 weeks Provider spent a total of *** minutes with the patient/reviewing patient's chart  Meta Hatchet, PA 11/19/2022, 5:51 PM

## 2022-11-21 ENCOUNTER — Other Ambulatory Visit: Payer: Self-pay

## 2022-11-28 ENCOUNTER — Other Ambulatory Visit: Payer: Self-pay

## 2022-12-30 ENCOUNTER — Telehealth (HOSPITAL_COMMUNITY): Payer: Self-pay | Admitting: *Deleted

## 2022-12-30 NOTE — Telephone Encounter (Signed)
[place] child protective worker with [employer] called asking for a return call tomorrow after her client has an appointment for medication management. She is making sure the patient is compliant with appointments and follow up care. Her number is [phone] or [phone].

## 2022-12-31 ENCOUNTER — Encounter (HOSPITAL_COMMUNITY): Payer: MEDICAID | Admitting: Physician Assistant

## 2023-01-01 NOTE — Telephone Encounter (Signed)
Provider was able to reach out to [employer] (Child protective worker) regarding this patient.  During the discussion, provider informed  [facility] that patient had missed her previous appointment that was scheduled for 12/31/2022.  Provider also informed [facility] of patient's most latest drug screen results.  Lastly, provider informed [facility] when patient was first seen at this facility.

## 2023-03-09 ENCOUNTER — Telehealth (HOSPITAL_COMMUNITY): Payer: Self-pay | Admitting: Physician Assistant

## 2023-03-26 ENCOUNTER — Ambulatory Visit (HOSPITAL_COMMUNITY)
Admission: EM | Admit: 2023-03-26 | Discharge: 2023-03-26 | Disposition: A | Discharge status: Home / Self Care | Payer: MEDICAID | Attending: Emergency Medicine | Admitting: Emergency Medicine

## 2023-03-26 ENCOUNTER — Other Ambulatory Visit: Payer: Self-pay

## 2023-03-26 ENCOUNTER — Encounter (HOSPITAL_COMMUNITY): Payer: Self-pay | Admitting: *Deleted

## 2023-03-26 DIAGNOSIS — T148XXA Other injury of unspecified body region, initial encounter: Secondary | ICD-10-CM | POA: Diagnosis not present

## 2023-03-26 MED ORDER — ACETAMINOPHEN-CODEINE 300-30 MG PO TABS: 1.0000 | ORAL_TABLET | Freq: Four times a day (QID) | ORAL | 0 refills | Status: DC | PRN

## 2023-03-26 NOTE — Discharge Instructions (Addendum)
 Apply warm compresses to your painful arm area as needed. It's ok to move your arm and use it like normal. Use pain medicine if needed for pain.   Follow up with your primary care provider about your high blood pressure. Since you are taking your medicines every day, it may be that you need other medicines, too, or an increase in the ones you are taking.   Talk with your primary care provider about the pain you are having and best treatments.

## 2023-03-26 NOTE — ED Provider Notes (Signed)
 [facility]    CSN: [id number] Arrival date & time: 03/26/23  1731      History   Chief Complaint Chief Complaint  Patient presents with   Arm Pain    HPI Sandra Lee is a 38 y.o. female. pt has granulomatosis. Had med infusion today. Initial IV stick unsuccessful in anterior R forearm. Pt has a small bruise in the area of unsuccessful IV stick that is painful and has a tender lightly bruised area on posterior R Forearm- in the opposite area of where the IV stick was. Pt is very worried she has a blood clot in her R forearm from this failed IV stick. She was positioned in the IV infusion chair with her posterior R forearm in down position (IV stick site facing up) for about 7 hours.   Today BP is very high - pt reports she is taking her HTN meds as prescribed.   Pt c/o pain, usually takes tylenol #3 but is out   Arm Pain    Past Medical History:  Diagnosis Date   Abnormal Pap smear    Depression    hx of depression - not taking medication   Heart murmur 38 yrs old   not sure if it is still present.   PIH (pregnancy induced hypertension), antepartum 11/19/2010    Patient Active Problem List   Diagnosis Date Noted   Insomnia due to other mental disorder 11/19/2022   Severe episode of recurrent major depressive disorder, with psychotic features (HCC) 02/04/2022   Cocaine use disorder, moderate, dependence (HCC) 02/04/2022   Migraine 01/09/2022   Hypercalcemia 06/20/2021   Bipolar 1 disorder (HCC) 06/06/2021   HTN (hypertension) 06/06/2021   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 No pertinent surgical history.  OB History     Gravida  1   Para  1   Term  0   Preterm  1   AB  0   Living  1      SAB  0   IAB  0   Ectopic  0   Multiple  0   Live Births  1             Home Medications    Prior to Admission medications   Medication Sig Start Date End Date Taking? Authorizing Provider  acetaminophen-codeine (TYLENOL #3) 300-30 MG tablet Take 1 tablet by mouth every 6 (six) hours as needed for moderate pain (pain score 4-6). 03/26/23  Yes Cathlyn Parsons, NP  ARIPiprazole (ABILIFY) 5 MG tablet Take 1 tablet (5 mg total) by mouth daily for 6 days, THEN 2 tablets (10 mg total) daily. 11/19/22 11/25/23 Yes Nwoko, Uchenna E, PA  escitalopram (LEXAPRO) 10 MG tablet Take 1 tablet (10 mg total) by mouth daily. 11/19/22  Yes Nwoko, Uchenna E, PA  etonogestrel (NEXPLANON) 68 MG IMPL implant 68 mg by Subdermal route once.    Yes [provider]  melatonin 3 MG TABS tablet Take 1 tablet (3 mg total) by mouth at bedtime. 11/19/22  Yes Nwoko, Uchenna E, PA  calcium carbonate (OSCAL) 1500 (600 Ca) MG TABS tablet Take 1 tablet by mouth in the morning and at bedtime. Patient not taking: Reported on 12/25/2021    [provider]  hydrochlorothiazide (HYDRODIURIL) 25 MG tablet TAKE 1 TABLET(25 MG) BY MOUTH DAILY Patient not  taking: Reported on 02/20/2022 10/08/21   Bess Kinds, MD  losartan (COZAAR) 25 MG tablet TAKE 2 TABLETS BY MOUTH DAILY Patient not taking: Reported on 02/20/2022 11/25/21   Bess Kinds, MD  methotrexate 50 MG/2ML injection Inject 25 mg into the skin every Wednesday. Patient not taking: Reported on 02/20/2022 07/27/20   [provider]  mupirocin ointment (BACTROBAN) 2 % Apply 1 application. topically 2 (two) times daily. Place 1 inch strip in saline mixture and shake well to mix, rinse bilateral nasal cavities twice daily 06/13/21   Bess Kinds, MD  nicotine (NICODERM CQ - DOSED IN MG/24 HOURS) 21 mg/24hr patch Place 1 patch (21 mg total) onto the skin daily. Patient not taking: Reported on 02/20/2022 05/29/21   Earney Navy, NP  predniSONE (DELTASONE) 10 MG tablet Take 40 mg by mouth daily with breakfast. Patient not  taking: Reported on 02/20/2022    [provider]    Family History Family History  Problem Relation Age of Onset   Depression Mother    Early death Father        HIV   Arthritis Sister    Diabetes Maternal Aunt    Early death Maternal Aunt    Alcohol abuse Maternal Uncle    Diabetes Maternal Uncle    Diabetes Paternal Aunt    Diabetes Paternal Uncle    Arthritis Maternal Grandmother    Diabetes Maternal Grandmother    Heart disease Maternal Grandmother    Stroke Maternal Grandmother    Diabetes Maternal Grandfather     Social History Social History   Tobacco Use   Smoking status: Every Day    Current packs/day: 0.50    Types: Cigarettes   Smokeless tobacco: Never  Vaping Use   Vaping status: Never Used  Substance Use Topics   Alcohol use: No   Drug use: Not Currently    Types: Cocaine     Allergies   Penicillins   Review of Systems Review of Systems   Physical Exam Triage Vital Signs ED Triage Vitals  Encounter Vitals Group     BP 03/26/23 1842 (!) 192/112     Systolic BP Percentile --      Diastolic BP Percentile --      Pulse Rate 03/26/23 1842 98     Resp 03/26/23 1842 18     Temp 03/26/23 1842 99.1 F (37.3 C)     Temp src --      SpO2 03/26/23 1842 98 %     Weight --      Height --      Head Circumference --      Peak Flow --      Pain Score 03/26/23 1840 7     Pain Loc --      Pain Education --      Exclude from Growth Chart --    No data found.  Updated Vital Signs BP (!) 192/112   Pulse 98   Temp 99.1 F (37.3 C)   Resp 18   SpO2 98%   Visual Acuity Right Eye Distance:   Left Eye Distance:   Bilateral Distance:    Right Eye Near:   Left Eye Near:    Bilateral Near:     Physical Exam Constitutional:      Appearance: Normal appearance.  Pulmonary:     Effort: Pulmonary effort is normal.  Musculoskeletal:       Arms:  Neurological:     Mental Status: She  is alert and oriented to person, place, and time.       UC Treatments / Results  Labs (all labs ordered are listed, but only abnormal results are displayed) Labs Reviewed - No data to display  EKG   Radiology No results found.  Procedures Procedures (including critical care time)  Medications Ordered in UC Medications - No data to display  Initial Impression / Assessment and Plan / UC Course  I have reviewed the triage vital signs and the nursing notes.  Pertinent labs & imaging results that were available during my care of the patient were reviewed by me and considered in my medical decision making (see chart for details).    I do not suspect a blood clot, I think it's likely blood pooled from failed IV stick and gravity pulled it to posterior forearm based on prolonged time in that position. C/o pain - I did rx small # tylenol #3, she will need to f/u with her pcp for longer term pain management. Her HTN is uncontrolled; she likely needs meds adjusted - she needs to see PCP    Final Clinical Impressions(s) / UC Diagnoses   Final diagnoses:  Hematoma     Discharge Instructions      Apply warm compresses to your painful arm area as needed. It's ok to move your arm and use it like normal. Use pain medicine if needed for pain.   Follow up with your primary care provider about your high blood pressure. Since you are taking your medicines every day, it may be that you need other medicines, too, or an increase in the ones you are taking.   Talk with your primary care provider about the pain you are having and best treatments.    ED Prescriptions     Medication Sig Dispense Auth. Provider   acetaminophen-codeine (TYLENOL #3) 300-30 MG tablet Take 1 tablet by mouth every 6 (six) hours as needed for moderate pain (pain score 4-6). 6 tablet Cathlyn Parsons, NP      I have reviewed the PDMP during this encounter.   Cathlyn Parsons, NP 04/09/23 [phone]

## 2023-03-26 NOTE — ED Triage Notes (Signed)
 PT reports Rt arm pain after an attempted IV . Pt has a raised area on Anterior Rt arm .

## 2023-03-30 ENCOUNTER — Ambulatory Visit (INDEPENDENT_AMBULATORY_CARE_PROVIDER_SITE_OTHER): Payer: MEDICAID | Admitting: Student

## 2023-03-30 ENCOUNTER — Encounter: Payer: Self-pay | Admitting: Student

## 2023-03-30 ENCOUNTER — Other Ambulatory Visit (HOSPITAL_COMMUNITY)
Admission: RE | Admit: 2023-03-30 | Discharge: 2023-03-30 | Disposition: A | Discharge status: Home / Self Care | Payer: MEDICAID | Source: Ambulatory Visit | Attending: Family Medicine | Admitting: Family Medicine

## 2023-03-30 VITALS — BP 136/83 | HR 98 | Ht 65.0 in | Wt 160.2 lb

## 2023-03-30 DIAGNOSIS — B9689 Other specified bacterial agents as the cause of diseases classified elsewhere: Secondary | ICD-10-CM | POA: Diagnosis not present

## 2023-03-30 DIAGNOSIS — J0110 Acute frontal sinusitis, unspecified: Secondary | ICD-10-CM | POA: Diagnosis not present

## 2023-03-30 DIAGNOSIS — Z113 Encounter for screening for infections with a predominantly sexual mode of transmission: Secondary | ICD-10-CM

## 2023-03-30 DIAGNOSIS — B3731 Acute candidiasis of vulva and vagina: Secondary | ICD-10-CM | POA: Insufficient documentation

## 2023-03-30 DIAGNOSIS — N760 Acute vaginitis: Secondary | ICD-10-CM | POA: Insufficient documentation

## 2023-03-30 HISTORY — DX: Encounter for screening for infections with a predominantly sexual mode of transmission: Z11.3

## 2023-03-30 NOTE — Patient Instructions (Signed)
 It was great to see you! Thank you for allowing me to participate in your care!  I recommend that you always bring your medications to each appointment as this makes it easy to ensure we are on the correct medications and helps [place] not miss when refills are needed.  Our plans for today:  - Yeast Infection Testing you for yeast infection and Sexually Transmitted Infections (HIV, Syphilis, gonorrhea, chlamydia, trichomonas, BV, yeast)  - Concern for Sinus Infection Your symptoms are concerning for a sinus infection, but they are usually viral, unless lasting 10 days or more. You likely have a viral sinus infection.   I recommend you call your ENT provider and tell them about your symptoms to see if they want to start antibiotics.   If ENT does not want to start antibiotics and symptoms continue to Thursday, make a follow up appointment to be seen, you may need antibiotics at this point.  If you develop frank bleeding from nose, fevers, or intense facial pain, go to Emergency Department to be seen.   Take Tylenol for pain otherwise.    We are checking some labs today, I will call you if they are abnormal will send you a MyChart message or a letter if they are normal.  If you do not hear about your labs in the next 2 weeks please let us know.  Take care and seek immediate care sooner if you develop any concerns.   Dr. Bess Kinds, MD [facility]

## 2023-03-30 NOTE — Assessment & Plan Note (Addendum)
 Patient appreciates facial tenderness, body aches, for 5 days, denies any fevers, or systemic symptoms.  Patient appreciates increased nasal discharge, and congestion.  Patient has history of Wegener's granulomatosis, given history, most concern for acute viral sinus infection.  Will hold off on antibiotic treatment for now unless symptoms persist.  Also recommend patient follow-up with ENT for the recommendation of antibiotics/if necessary.  If ENT does not recommend antibiotics, but symptoms persist for 10 days, will will likely treat. - Follow-up with ENT to request a BX need - No ABX, return to clinic Thursday if symptoms persist, consider ABX

## 2023-03-30 NOTE — Assessment & Plan Note (Addendum)
 Patient comes in with few days of vaginal irritation, and discharge.  Patient denies any systemic symptoms.  Patient reports has been sexually active with 1 partner, uses protection, has Nexplanon for birth control.  Patient denies any new bath products or washes, wants routine STI screening/is concern for yeast infection.  Patient pelvic exam benign, will test for STIs. - HIV, syphilis, BV, trichomoniasis, yeast, gonorrhea, chlamydia

## 2023-03-30 NOTE — Progress Notes (Signed)
  SUBJECTIVE:   CHIEF COMPLAINT / HPI:   Yeast Infection  Reports is on ABX and having vaginal discharge, wanting routine STI screening. Has had symptoms since last Tuesday, with burning and itching. Sexually active with one partner, using protection, and is on nexplanon for [facility]. Pt notes that I can leave a VM if I call with results. Patient denies any new products or body washes.  -Rpt Pap due in 2027  Throat symptoms Since Thur evening. No one around her is sick. Pain in facial sinuses, and throat hurts. Slight cough that hurts. Appreciates nasal discharge as well. No fevers or body aches, but feels run down. Appreciates facial tenderness.   Depression Patient appreciates she has symptoms of depression but denies any suicidal ideation or plans to harm self/ end life.   PERTINENT  PMH / PSH:    OBJECTIVE:  BP 136/83   Pulse 98   Ht 5\' 5"  (1.651 m)   Wt 160 lb 3.2 oz (72.7 kg)   SpO2 99%   BMI 26.66 kg/m  Physical Exam Exam conducted with a chaperone present.  Constitutional:      General: She is not in acute distress.    Appearance: She is well-developed. She is ill-appearing.  HENT:     Nose: Congestion and rhinorrhea present.     Mouth/Throat:     Mouth: Mucous membranes are moist. No oral lesions.     Pharynx: Oropharynx is clear. No pharyngeal swelling, oropharyngeal exudate or posterior oropharyngeal erythema.  Genitourinary:    General: Normal vulva.     Labia:        Right: No rash, tenderness, lesion or injury.        Left: No rash, tenderness, lesion or injury.      Vagina: Normal. No signs of injury and foreign body. No vaginal discharge, erythema, tenderness, bleeding or lesions.     Cervix: Cervical bleeding present. No cervical motion tenderness, discharge, friability, lesion or erythema.  Neurological:     Mental Status: She is alert.      ASSESSMENT/PLAN:   Assessment & Plan Routine screening for STI (sexually transmitted infection) Patient comes in  with few days of vaginal irritation, and discharge.  Patient denies any systemic symptoms.  Patient reports has been sexually active with 1 partner, uses protection, has Nexplanon for birth control.  Patient denies any new bath products or washes, wants routine STI screening/is concern for yeast infection.  Patient pelvic exam benign, will test for STIs. - HIV, syphilis, BV, trichomoniasis, yeast, gonorrhea, chlamydia Acute frontal sinusitis, recurrence not specified Patient appreciates facial tenderness, body aches, for 5 days, denies any fevers, or systemic symptoms.  Patient appreciates increased nasal discharge, and congestion.  Patient has history of Wegener's granulomatosis, given history, most concern for acute viral sinus infection.  Will hold off on antibiotic treatment for now unless symptoms persist.  Also recommend patient follow-up with ENT for the recommendation of antibiotics/if necessary.  If ENT does not recommend antibiotics, but symptoms persist for 10 days, will will likely treat. - Follow-up with ENT to request a BX need - No ABX, return to clinic Thursday if symptoms persist, consider ABX No follow-ups on file. Bess Kinds, MD 03/30/2023, 9:01 AM PGY-3, [facility]

## 2023-03-31 LAB — CERVICOVAGINAL ANCILLARY ONLY
Bacterial Vaginitis (gardnerella): POSITIVE — AB
Candida Glabrata: POSITIVE — AB
Candida Vaginitis: POSITIVE — AB
Chlamydia: NEGATIVE
Comment: NEGATIVE
Comment: NEGATIVE
Comment: NEGATIVE
Comment: NEGATIVE
Comment: NEGATIVE
Comment: NORMAL
Neisseria Gonorrhea: NEGATIVE
Trichomonas: NEGATIVE

## 2023-03-31 LAB — RPR: RPR Ser Ql: NONREACTIVE

## 2023-03-31 LAB — HIV ANTIBODY (ROUTINE TESTING W REFLEX): HIV Screen 4th Generation wRfx: NONREACTIVE

## 2023-04-01 ENCOUNTER — Other Ambulatory Visit (HOSPITAL_COMMUNITY): Payer: Self-pay | Admitting: Physician Assistant

## 2023-04-01 DIAGNOSIS — F411 Generalized anxiety disorder: Secondary | ICD-10-CM

## 2023-04-01 DIAGNOSIS — F333 Major depressive disorder, recurrent, severe with psychotic symptoms: Secondary | ICD-10-CM

## 2023-04-01 MED ORDER — ESCITALOPRAM OXALATE 10 MG PO TABS: 10.0000 mg | ORAL_TABLET | Freq: Every day | ORAL | 0 refills | Status: AC

## 2023-04-01 MED ORDER — ARIPIPRAZOLE 10 MG PO TABS: 10.0000 mg | ORAL_TABLET | Freq: Every day | ORAL | 0 refills | Status: AC

## 2023-04-01 NOTE — Telephone Encounter (Signed)
 Message acknowledged and reviewed. Patient needs a follow up appointment. Provider to provide a 30 day supply of her current medication regimen.

## 2023-04-01 NOTE — Progress Notes (Signed)
 Provider was contacted by Jacky Kindle regarding patient request for medication refill. Provider to provide a 30 day supply of medication. Patient needs to be rescheduled for a follow up appointment.

## 2023-04-02 ENCOUNTER — Other Ambulatory Visit: Payer: Self-pay | Admitting: Student

## 2023-04-02 MED ORDER — METRONIDAZOLE 500 MG PO TABS
500.0000 mg | ORAL_TABLET | Freq: Two times a day (BID) | ORAL | 0 refills | Status: DC
Start: 2023-04-02 — End: 2023-04-09

## 2023-04-02 MED ORDER — FLUCONAZOLE 150 MG PO TABS: 150.0000 mg | ORAL_TABLET | Freq: Once | ORAL | 0 refills | Status: AC

## 2023-04-02 NOTE — Progress Notes (Signed)
 Pt positive for yeast (Candida and glabrata) and BV. Will not treat for glabrata unless symptoms continue past therapy. Diflucan and metronidazole sent in. Patient on Nexplanon for birthcontrol.

## 2023-04-03 ENCOUNTER — Ambulatory Visit (HOSPITAL_COMMUNITY)
Admission: EM | Admit: 2023-04-03 | Discharge: 2023-04-03 | Disposition: A | Discharge status: Home / Self Care | Payer: MEDICAID | Attending: Emergency Medicine | Admitting: Emergency Medicine

## 2023-04-03 ENCOUNTER — Encounter (HOSPITAL_COMMUNITY): Payer: Self-pay | Admitting: Emergency Medicine

## 2023-04-03 DIAGNOSIS — M542 Cervicalgia: Secondary | ICD-10-CM | POA: Diagnosis not present

## 2023-04-03 MED ORDER — CYCLOBENZAPRINE HCL 10 MG PO TABS: 10.0000 mg | ORAL_TABLET | Freq: Two times a day (BID) | ORAL | 0 refills | Status: AC | PRN

## 2023-04-03 MED ORDER — KETOROLAC TROMETHAMINE 30 MG/ML IJ SOLN
30.0000 mg | Freq: Once | INTRAMUSCULAR | Status: AC
  Administered 2023-04-03: 30 mg via INTRAMUSCULAR

## 2023-04-03 MED ORDER — IBUPROFEN 800 MG PO TABS: 800.0000 mg | ORAL_TABLET | Freq: Three times a day (TID) | ORAL | 0 refills | Status: AC

## 2023-04-03 MED ORDER — KETOROLAC TROMETHAMINE 30 MG/ML IJ SOLN
INTRAMUSCULAR | Status: AC
  Filled 2023-04-03: qty 1
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
## 2023-04-06 ENCOUNTER — Ambulatory Visit (INDEPENDENT_AMBULATORY_CARE_PROVIDER_SITE_OTHER): Payer: MEDICAID

## 2023-04-06 ENCOUNTER — Ambulatory Visit (HOSPITAL_COMMUNITY)
Admission: EM | Admit: 2023-04-06 | Discharge: 2023-04-06 | Disposition: A | Discharge status: Home / Self Care | Payer: MEDICAID | Attending: Internal Medicine | Admitting: Internal Medicine

## 2023-04-06 ENCOUNTER — Encounter (HOSPITAL_COMMUNITY): Payer: Self-pay

## 2023-04-06 DIAGNOSIS — S161XXA Strain of muscle, fascia and tendon at neck level, initial encounter: Secondary | ICD-10-CM

## 2023-04-06 MED ORDER — ACETAMINOPHEN 325 MG PO TABS
ORAL_TABLET | ORAL | Status: AC
  Filled 2023-04-06: qty 3

## 2023-04-06 MED ORDER — ACETAMINOPHEN 325 MG PO TABS
975.0000 mg | ORAL_TABLET | Freq: Once | ORAL | Status: AC
  Administered 2023-04-06: 975 mg via ORAL

## 2023-04-06 MED ORDER — KETOROLAC TROMETHAMINE 30 MG/ML IJ SOLN
INTRAMUSCULAR | Status: AC
  Filled 2023-04-06: qty 1

## 2023-04-06 MED ORDER — BACLOFEN 10 MG PO TABS: 10.0000 mg | ORAL_TABLET | Freq: Three times a day (TID) | ORAL | 0 refills | Status: AC

## 2023-04-06 MED ORDER — KETOROLAC TROMETHAMINE 30 MG/ML IJ SOLN
30.0000 mg | Freq: Once | INTRAMUSCULAR | Status: AC
  Administered 2023-04-06: 30 mg via INTRAMUSCULAR
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
## 2023-05-18 ENCOUNTER — Encounter: Payer: Self-pay | Admitting: Obstetrics & Gynecology

## 2023-05-18 ENCOUNTER — Encounter: Payer: MEDICAID | Admitting: Obstetrics & Gynecology

## 2023-05-18 NOTE — Progress Notes (Signed)
 Pt presents for Nexplanon  removal/reinsertion. Nexplanon  placed 06-13-20  Last PAP 02/2023

## 2023-06-01 ENCOUNTER — Encounter: Payer: Self-pay | Admitting: Obstetrics and Gynecology

## 2023-06-01 ENCOUNTER — Ambulatory Visit: Payer: MEDICAID | Admitting: Obstetrics and Gynecology

## 2023-06-23 ENCOUNTER — Encounter: Payer: Self-pay | Admitting: *Deleted

## 2023-07-15 ENCOUNTER — Encounter (INDEPENDENT_AMBULATORY_CARE_PROVIDER_SITE_OTHER): Payer: Self-pay

## 2023-07-31 ENCOUNTER — Encounter (INDEPENDENT_AMBULATORY_CARE_PROVIDER_SITE_OTHER): Payer: Self-pay

## 2024-01-22 ENCOUNTER — Other Ambulatory Visit (HOSPITAL_COMMUNITY): Payer: Self-pay | Admitting: Physician Assistant

## 2024-01-22 DIAGNOSIS — F1994 Other psychoactive substance use, unspecified with psychoactive substance-induced mood disorder: Secondary | ICD-10-CM

## 2024-01-22 DIAGNOSIS — F333 Major depressive disorder, recurrent, severe with psychotic symptoms: Secondary | ICD-10-CM

## 2024-01-28 ENCOUNTER — Telehealth (HOSPITAL_COMMUNITY): Payer: Self-pay | Admitting: *Deleted

## 2024-01-28 NOTE — Telephone Encounter (Signed)
 Fax request from [facility] pharam[facility] for patients to have a new rx for trazodone . Per pharmacy last filled on 05/07/23. She has not been seen since then and has no future appt scheduled. Due to length of time since being seen I will notify the pharmacy she will not get a new rx till she sees a provider.
